# Patient Record
Sex: Female | Born: 1935 | Race: White | Hispanic: No | Smoking: Never smoker
Health system: Southern US, Community
[De-identification: ages and names within clinical notes are randomized; demographics above are authoritative.]

## PROBLEM LIST (undated history)

## (undated) DIAGNOSIS — F0390 Unspecified dementia without behavioral disturbance: Secondary | ICD-10-CM

## (undated) DIAGNOSIS — C801 Malignant (primary) neoplasm, unspecified: Secondary | ICD-10-CM

## (undated) DIAGNOSIS — E785 Hyperlipidemia, unspecified: Secondary | ICD-10-CM

## (undated) DIAGNOSIS — H409 Unspecified glaucoma: Secondary | ICD-10-CM

## (undated) DIAGNOSIS — I10 Essential (primary) hypertension: Secondary | ICD-10-CM

## (undated) DIAGNOSIS — K5790 Diverticulosis of intestine, part unspecified, without perforation or abscess without bleeding: Secondary | ICD-10-CM

## (undated) DIAGNOSIS — M8580 Other specified disorders of bone density and structure, unspecified site: Secondary | ICD-10-CM

## (undated) HISTORY — PX: CATARACT EXTRACTION, BILATERAL: SHX1313

## (undated) HISTORY — PX: OOPHORECTOMY: SHX86

## (undated) HISTORY — DX: Unspecified glaucoma: H40.9

## (undated) HISTORY — PX: TONSILLECTOMY: SHX5217

## (undated) HISTORY — DX: Unspecified dementia, unspecified severity, without behavioral disturbance, psychotic disturbance, mood disturbance, and anxiety: F03.90

## (undated) HISTORY — DX: Hyperlipidemia, unspecified: E78.5

## (undated) HISTORY — DX: Essential (primary) hypertension: I10

## (undated) HISTORY — DX: Other specified disorders of bone density and structure, unspecified site: M85.80

## (undated) HISTORY — DX: Diverticulosis of intestine, part unspecified, without perforation or abscess without bleeding: K57.90

---

## 1997-09-16 ENCOUNTER — Other Ambulatory Visit: Admission: RE | Admit: 1997-09-16 | Discharge: 1997-09-16 | Payer: Self-pay | Admitting: Cardiology

## 1997-09-21 ENCOUNTER — Other Ambulatory Visit: Admission: RE | Admit: 1997-09-21 | Discharge: 1997-09-21 | Payer: Self-pay | Admitting: Gynecology

## 1998-09-23 ENCOUNTER — Other Ambulatory Visit: Admission: RE | Admit: 1998-09-23 | Discharge: 1998-09-23 | Payer: Self-pay | Admitting: Gynecology

## 1999-09-07 ENCOUNTER — Encounter: Admission: RE | Admit: 1999-09-07 | Discharge: 1999-09-07 | Payer: Self-pay | Admitting: Gynecology

## 1999-09-07 ENCOUNTER — Encounter: Payer: Self-pay | Admitting: Gynecology

## 1999-11-15 ENCOUNTER — Other Ambulatory Visit: Admission: RE | Admit: 1999-11-15 | Discharge: 1999-11-15 | Payer: Self-pay | Admitting: Gynecology

## 2000-04-23 ENCOUNTER — Encounter: Admission: RE | Admit: 2000-04-23 | Discharge: 2000-04-23 | Payer: Self-pay | Admitting: Gynecology

## 2000-04-23 ENCOUNTER — Encounter: Payer: Self-pay | Admitting: Gynecology

## 2000-11-21 ENCOUNTER — Other Ambulatory Visit: Admission: RE | Admit: 2000-11-21 | Discharge: 2000-11-21 | Payer: Self-pay | Admitting: Gynecology

## 2000-11-29 ENCOUNTER — Encounter: Admission: RE | Admit: 2000-11-29 | Discharge: 2000-11-29 | Payer: Self-pay | Admitting: Gynecology

## 2000-11-29 ENCOUNTER — Encounter: Payer: Self-pay | Admitting: Gynecology

## 2000-12-18 ENCOUNTER — Encounter: Payer: Self-pay | Admitting: Gynecology

## 2000-12-18 ENCOUNTER — Encounter: Admission: RE | Admit: 2000-12-18 | Discharge: 2000-12-18 | Payer: Self-pay | Admitting: Gynecology

## 2001-12-03 ENCOUNTER — Other Ambulatory Visit: Admission: RE | Admit: 2001-12-03 | Discharge: 2001-12-03 | Payer: Self-pay | Admitting: Gynecology

## 2001-12-19 ENCOUNTER — Encounter: Admission: RE | Admit: 2001-12-19 | Discharge: 2001-12-19 | Payer: Self-pay | Admitting: Gynecology

## 2001-12-19 ENCOUNTER — Encounter: Payer: Self-pay | Admitting: Gynecology

## 2002-12-25 ENCOUNTER — Encounter: Payer: Self-pay | Admitting: Gynecology

## 2002-12-25 ENCOUNTER — Encounter: Admission: RE | Admit: 2002-12-25 | Discharge: 2002-12-25 | Payer: Self-pay | Admitting: Gynecology

## 2003-12-11 ENCOUNTER — Other Ambulatory Visit: Admission: RE | Admit: 2003-12-11 | Discharge: 2003-12-11 | Payer: Self-pay | Admitting: Gynecology

## 2004-01-19 ENCOUNTER — Encounter: Admission: RE | Admit: 2004-01-19 | Discharge: 2004-01-19 | Payer: Self-pay | Admitting: Gynecology

## 2005-02-07 ENCOUNTER — Encounter: Admission: RE | Admit: 2005-02-07 | Discharge: 2005-02-07 | Payer: Self-pay | Admitting: Gynecology

## 2005-12-27 ENCOUNTER — Other Ambulatory Visit: Admission: RE | Admit: 2005-12-27 | Discharge: 2005-12-27 | Payer: Self-pay | Admitting: Gynecology

## 2006-02-13 ENCOUNTER — Encounter: Admission: RE | Admit: 2006-02-13 | Discharge: 2006-02-13 | Payer: Self-pay | Admitting: Gynecology

## 2007-01-09 ENCOUNTER — Other Ambulatory Visit: Admission: RE | Admit: 2007-01-09 | Discharge: 2007-01-09 | Payer: Self-pay | Admitting: Gynecology

## 2007-04-02 ENCOUNTER — Encounter: Admission: RE | Admit: 2007-04-02 | Discharge: 2007-04-02 | Payer: Self-pay | Admitting: Gynecology

## 2008-04-07 ENCOUNTER — Encounter: Admission: RE | Admit: 2008-04-07 | Discharge: 2008-04-07 | Payer: Self-pay | Admitting: Gynecology

## 2009-04-26 ENCOUNTER — Encounter: Admission: RE | Admit: 2009-04-26 | Discharge: 2009-04-26 | Payer: Self-pay | Admitting: Cardiology

## 2009-05-25 ENCOUNTER — Encounter: Admission: RE | Admit: 2009-05-25 | Discharge: 2009-05-25 | Payer: Self-pay | Admitting: Cardiology

## 2010-05-22 HISTORY — PX: ABDOMINAL HYSTERECTOMY: SHX81

## 2010-06-23 ENCOUNTER — Ambulatory Visit (INDEPENDENT_AMBULATORY_CARE_PROVIDER_SITE_OTHER): Payer: Medicare Other | Admitting: Cardiology

## 2010-06-23 DIAGNOSIS — Z79899 Other long term (current) drug therapy: Secondary | ICD-10-CM

## 2010-06-23 DIAGNOSIS — E7800 Pure hypercholesterolemia, unspecified: Secondary | ICD-10-CM

## 2010-11-28 ENCOUNTER — Encounter: Payer: Self-pay | Admitting: Cardiology

## 2010-12-05 ENCOUNTER — Ambulatory Visit (INDEPENDENT_AMBULATORY_CARE_PROVIDER_SITE_OTHER): Payer: Medicare Other | Admitting: Cardiology

## 2010-12-05 ENCOUNTER — Encounter: Payer: Self-pay | Admitting: Cardiology

## 2010-12-05 DIAGNOSIS — E785 Hyperlipidemia, unspecified: Secondary | ICD-10-CM

## 2010-12-05 DIAGNOSIS — Z79899 Other long term (current) drug therapy: Secondary | ICD-10-CM

## 2010-12-05 DIAGNOSIS — I119 Hypertensive heart disease without heart failure: Secondary | ICD-10-CM

## 2010-12-05 DIAGNOSIS — E7800 Pure hypercholesterolemia, unspecified: Secondary | ICD-10-CM | POA: Insufficient documentation

## 2010-12-05 DIAGNOSIS — F0390 Unspecified dementia without behavioral disturbance: Secondary | ICD-10-CM

## 2010-12-05 NOTE — Assessment & Plan Note (Signed)
The patient has a past history of essential hypertension.Her pressure has responded nicely to [facility].  Her blood pressure has been easier to control since she has lost much weight.  The patient denies chest pain or shortness of breath dizziness or syncope.

## 2010-12-05 NOTE — Assessment & Plan Note (Signed)
The patient has a past history of hypercholesterolemia.  She is on Lipitor 10 mg daily.  She has not been experiencing any myalgias or other side effects from the statin therapy.

## 2010-12-05 NOTE — Progress Notes (Signed)
Shelley Adams Date of Birth:  08/27/1922 [facility] Cardiology / [facility] [address]   [address] [place], [place]  [address] [phone]           Fax   [phone]  History of Present Illness: This pleasant 77 year old woman is seen for a four-month followup office visit.  She has a history of essential hypertension and hypercholesterolemia.  She also has worsening dementia.  She has seen Dr. Anne Hahn who added Namenda to her regimen and she continues on Aricept as well the patient has not been expressing any new cardiac symptoms.  She denies any chest pain or shortness of breath her weight has stabilized since last visit and she eats one good meal daily when her family takes her out for lunch.  The patient is able to continue to live in her on home.  Current Outpatient Prescriptions  Medication Sig Dispense Refill  . atorvastatin (LIPITOR) 10 MG tablet Take 10 mg by mouth daily.        Marland Kitchen donepezil (ARICEPT) 5 MG tablet Take 5 mg by mouth at bedtime as needed.        . Memantine HCl (NAMENDA PO) Take 2 tablets by mouth daily. Taking one daily      . Multiple Vitamin (MULTIVITAMIN PO) Take 1 tablet by mouth daily.        . temazepam (RESTORIL) 15 MG capsule Take 15 mg by mouth at bedtime as needed.        . triamterene-hydrochlorothiazide (MAXZIDE-25) 37.5-25 MG per tablet Take 1 tablet by mouth daily. Taking 1/2 every other day      . Calcium Citrate (CITRACAL PO) Take 1 tablet by mouth daily.          No Known Allergies  Patient Active Problem List  Diagnoses  . Dementia  . Hypercholesterolemia  . Benign hypertensive heart disease without heart failure    History  Smoking status  . Never Smoker   Smokeless tobacco  . Not on file    History  Alcohol Use No    Family History  Problem Relation Age of Onset  . Coronary artery disease Father   . Transient ischemic attack Father   . Heart attack Brother   . Parkinsonism Sister     Review of  Systems: Constitutional: no fever chills diaphoresis or fatigue or change in weight.  Head and neck: no hearing loss, no epistaxis, no photophobia or visual disturbance. Respiratory: No cough, shortness of breath or wheezing. Cardiovascular: No chest pain peripheral edema, palpitations. Gastrointestinal: No abdominal distention, no abdominal pain, no change in bowel habits hematochezia or melena. Genitourinary: No dysuria, no frequency, no urgency, no nocturia. Musculoskeletal:No arthralgias, no back pain, no gait disturbance or myalgias. Neurological: No dizziness, no headaches, no numbness, no seizures, no syncope, no weakness, no tremors. Hematologic: No lymphadenopathy, no easy bruising. Psychiatric: No confusion, no hallucinations, no sleep disturbance.    Physical Exam: Filed Vitals:   12/05/10 1007  BP: 110/70  Pulse: 80  The general appearance reveals a thin elderly woman in no acute distress.Pupils equal and reactive.   Extraocular Movements are full.  There is no scleral icterus.  The mouth and pharynx are normal.  The neck is supple.  The carotids reveal no bruits.  The jugular venous pressure is normal.  The thyroid is not enlarged.  There is no lymphadenopathy.The chest is clear to percussion and auscultation. There are no rales or rhonchi. Expansion of the chest is symmetrical.The precordium  is quiet.  The first heart sound is normal.  The second heart sound is physiologically split.  There is no murmur gallop rub or click.  There is no abnormal lift or heave.The abdomen is soft and nontender. Bowel sounds are normal. The liver and spleen are not enlarged. There Are no abdominal masses. There are no bruits.The pedal pulses are good.  There is no phlebitis or edema.  There is no cyanosis or clubbing.  Neurologic exam reveals dementia and the patient asked the same question over and over.The skin is warm and dry.  There is no rash.   Assessment / Plan:  The patient forgot to fast  today so did not draw any blood.  We will postpone that until next visit.  She'll return in about 5 months for followup office visit and fasting lipid panel and chemistries

## 2010-12-05 NOTE — Assessment & Plan Note (Signed)
This pleasant 77 year old woman has a history of progressive dementia.  She is on Aricept and Namenda.  She has seen Dr. Anne Hahn in the past.  She continues to be able to live in her own home.  Her family hopes to allow her to continue there as long as possible.  Her family takes her out to lunch each day and she gets a good meal at a restaurant once a day.  On her previous visit she had lost 7 pounds but this time she has gained 3 pounds.

## 2011-02-06 ENCOUNTER — Other Ambulatory Visit: Payer: Self-pay | Admitting: Cardiology

## 2011-02-06 NOTE — Telephone Encounter (Signed)
Refilled aricept

## 2011-04-05 ENCOUNTER — Telehealth: Payer: Self-pay | Admitting: *Deleted

## 2011-04-05 ENCOUNTER — Other Ambulatory Visit: Payer: Self-pay | Admitting: Cardiology

## 2011-04-05 NOTE — Telephone Encounter (Signed)
[facility] should be refilled by Dr. Anne Hahn

## 2011-04-24 ENCOUNTER — Other Ambulatory Visit: Payer: Medicare Other | Admitting: *Deleted

## 2011-05-01 ENCOUNTER — Encounter: Payer: Self-pay | Admitting: Cardiology

## 2011-05-01 ENCOUNTER — Ambulatory Visit (INDEPENDENT_AMBULATORY_CARE_PROVIDER_SITE_OTHER): Payer: Medicare Other | Admitting: Cardiology

## 2011-05-01 ENCOUNTER — Other Ambulatory Visit (INDEPENDENT_AMBULATORY_CARE_PROVIDER_SITE_OTHER): Payer: Medicare Other | Admitting: *Deleted

## 2011-05-01 VITALS — BP 108/64 | HR 80 | Ht 64.0 in | Wt 92.0 lb

## 2011-05-01 DIAGNOSIS — I119 Hypertensive heart disease without heart failure: Secondary | ICD-10-CM

## 2011-05-01 DIAGNOSIS — E785 Hyperlipidemia, unspecified: Secondary | ICD-10-CM

## 2011-05-01 DIAGNOSIS — E7800 Pure hypercholesterolemia, unspecified: Secondary | ICD-10-CM

## 2011-05-01 DIAGNOSIS — F0390 Unspecified dementia without behavioral disturbance: Secondary | ICD-10-CM

## 2011-05-01 DIAGNOSIS — Z79899 Other long term (current) drug therapy: Secondary | ICD-10-CM

## 2011-05-01 LAB — LIPID PANEL
Cholesterol: 146 mg/dL (ref 0–200)
LDL Cholesterol: 58 mg/dL (ref 0–99)

## 2011-05-01 LAB — HEPATIC FUNCTION PANEL: Bilirubin, Direct: 0.2 mg/dL (ref 0.0–0.3)

## 2011-05-01 LAB — BASIC METABOLIC PANEL
BUN: 24 mg/dL — ABNORMAL HIGH (ref 6–23)
CO2: 32 mEq/L (ref 19–32)
Creatinine, Ser: 0.8 mg/dL (ref 0.4–1.2)
GFR: 69.17 mL/min (ref >60.00)
Potassium: 3.8 mEq/L (ref 3.5–5.1)
Sodium: 141 mEq/L (ref 135–145)

## 2011-05-01 NOTE — Assessment & Plan Note (Signed)
The patient is followed by Dr. Anne Hahn for her dementia.  She is on Namenda and Aricept.  Clinically to me she appears to be about the same as last visit

## 2011-05-01 NOTE — Progress Notes (Signed)
Shelley Adams Date of Birth:  December 26, 1922 [facility] Cardiology / [facility] [address]   [address] [place], [place]  [address] [phone]           Fax   [phone]  History of Present Illness: This pleasant 77 year old woman is seen for a scheduled followup office visit.  She has a history of essential hypertension and hypercholesterolemia.  She also has dementia.  She has had problems maintaining her weight.  According to her family she is very sparingly.  The family has arranged for her to go out every noontime with friends or family for a meal.  Otherwise they gave her food from the grocery store that she can easily microwave.  However she does not eat much at a time.  Her weight has gone up 1 pound since last visit however.  Current Outpatient Prescriptions  Medication Sig Dispense Refill  . ARICEPT 5 MG tablet TAKE 1 TABLET DAILY.  30 each  11  . atorvastatin (LIPITOR) 10 MG tablet Take 10 mg by mouth daily.        . Multiple Vitamin (MULTIVITAMIN PO) Take 1 tablet by mouth daily.        Marland Kitchen NAMENDA 10 MG tablet TAKE 1 TABLET TWICE DAILY.  60 each  5  . temazepam (RESTORIL) 15 MG capsule Take 15 mg by mouth at bedtime as needed.        . triamterene-hydrochlorothiazide (MAXZIDE-25) 37.5-25 MG per tablet Take 1 tablet by mouth daily. Taking 1/2 every other day        No Known Allergies  Patient Active Problem List  Diagnoses  . Dementia  . Hypercholesterolemia  . Benign hypertensive heart disease without heart failure    History  Smoking status  . Never Smoker   Smokeless tobacco  . Not on file    History  Alcohol Use No    Family History  Problem Relation Age of Onset  . Coronary artery disease Father   . Transient ischemic attack Father   . Heart attack Brother   . Parkinsonism Sister     Review of Systems: Constitutional: no fever chills diaphoresis or fatigue or change in weight.  Head and neck: no hearing loss, no epistaxis, no photophobia or  visual disturbance. Respiratory: No cough, shortness of breath or wheezing. Cardiovascular: No chest pain peripheral edema, palpitations. Gastrointestinal: No abdominal distention, no abdominal pain, no change in bowel habits hematochezia or melena. Genitourinary: No dysuria, no frequency, no urgency, no nocturia. Musculoskeletal:No arthralgias, no back pain, no gait disturbance or myalgias. Neurological: No dizziness, no headaches, no numbness, no seizures, no syncope, no weakness, no tremors. Hematologic: No lymphadenopathy, no easy bruising. Psychiatric: No confusion, no hallucinations, no sleep disturbance.    Physical Exam: Filed Vitals:   05/01/11 1109  BP: 108/64  Pulse: 80   the general appearance reveals a well-developed well-nourished thin woman in no distress.  She has mild dementia but is very pleasant socially.Pupils equal and reactive.   Extraocular Movements are full.  There is no scleral icterus.  The mouth and pharynx are normal.  The neck is supple.  The carotids reveal no bruits.  The jugular venous pressure is normal.  The thyroid is not enlarged.  There is no lymphadenopathy.  The chest is clear to percussion and auscultation. There are no rales or rhonchi. Expansion of the chest is symmetrical.  The precordium is quiet.  The first heart sound is normal.  The second heart sound is  physiologically split.  There is no murmur gallop rub or click.  There is no abnormal lift or heave.  The abdomen is soft and nontender. Bowel sounds are normal. The liver and spleen are not enlarged. There Are no abdominal masses. There are no bruits.  The pedal pulses are good.  There is no phlebitis or edema.  There is no cyanosis or clubbing. Strength is normal and symmetrical in all extremities.  There is no lateralizing weakness.  There are no sensory deficits.  The skin is warm and dry.  There is no rash.    Assessment / Plan: Continue on same medication.  Try to gain some  weight.  She will use protein supplements as necessary.  Recheck in 5 months for followup office visit lipid panel hepatic function panel and basal metabolic

## 2011-05-01 NOTE — Patient Instructions (Signed)
Your physician recommends that you continue on your current medications as directed. Please refer to the Current Medication list given to you today. Try to eat more to increase your weight Your physician wants you to follow-up in: 5 months You will receive a reminder letter in the mail two months in advance. If you don't receive a letter, please call our office to schedule the follow-up appointment.

## 2011-05-01 NOTE — Assessment & Plan Note (Signed)
Patient has a history of hypercholesterolemia.  She remains on low-dose Lipitor.  She is not having any myalgias or side effects from the Lipitor.

## 2011-05-01 NOTE — Assessment & Plan Note (Signed)
Patient has a history of high cholesterol and high blood pressure.  She has not been having any symptoms of headaches or dizziness.  No symptoms of congestive heart failure.  She remains on low-dose diuretic for blood pressure

## 2011-05-02 ENCOUNTER — Telehealth: Payer: Self-pay | Admitting: *Deleted

## 2011-05-02 NOTE — Telephone Encounter (Signed)
Message copied by Burnell Blanks on Tue May 02, 2011  2:14 PM ------      Message from: Cassell Clement      Created: Mon May 01, 2011  9:04 PM       Please report.  The labs are stable.  Continue same meds.  Continue careful diet.Report to daughter.

## 2011-05-02 NOTE — Telephone Encounter (Signed)
Advised daughter 

## 2011-06-20 ENCOUNTER — Other Ambulatory Visit: Payer: Self-pay | Admitting: Cardiology

## 2011-06-20 ENCOUNTER — Other Ambulatory Visit: Payer: Self-pay

## 2011-06-20 MED ORDER — ATORVASTATIN CALCIUM 10 MG PO TABS: 10.0000 mg | ORAL_TABLET | Freq: Every day | ORAL | Status: DC

## 2011-07-01 ENCOUNTER — Other Ambulatory Visit: Payer: Self-pay | Admitting: Cardiology

## 2011-07-03 NOTE — Telephone Encounter (Signed)
Refilled generic maxzide

## 2011-10-04 ENCOUNTER — Encounter: Payer: Self-pay | Admitting: Cardiology

## 2011-10-04 ENCOUNTER — Ambulatory Visit (INDEPENDENT_AMBULATORY_CARE_PROVIDER_SITE_OTHER): Payer: Medicare Other | Admitting: Cardiology

## 2011-10-04 VITALS — BP 118/80 | HR 88 | Ht 62.0 in | Wt 90.0 lb

## 2011-10-04 DIAGNOSIS — E7800 Pure hypercholesterolemia, unspecified: Secondary | ICD-10-CM

## 2011-10-04 DIAGNOSIS — F0390 Unspecified dementia without behavioral disturbance: Secondary | ICD-10-CM

## 2011-10-04 DIAGNOSIS — N950 Postmenopausal bleeding: Secondary | ICD-10-CM

## 2011-10-04 DIAGNOSIS — I119 Hypertensive heart disease without heart failure: Secondary | ICD-10-CM

## 2011-10-04 NOTE — Progress Notes (Signed)
Shelley Adams Date of Birth:  1923/05/09 [facility] [address] [place], [place]  [address] [phone]         Fax   [phone]  History of Present Illness: This pleasant 78 year old widowed Caucasian female is seen for a four-month followup office visit.  She has a past history of essential hypertension and hypercholesterolemia.  She also has severe dementia.  She has had progressive weight loss due to not eating enough food.  For the past 4 months she has had some vaginal spotting and bleeding that she had not told her family about until recently.  She denies any abdominal pain.  Current Outpatient Prescriptions  Medication Sig Dispense Refill  . ARICEPT 5 MG tablet TAKE 1 TABLET DAILY.  30 each  11  . atorvastatin (LIPITOR) 10 MG tablet Take 1 tablet (10 mg total) by mouth daily.  30 tablet  4  . MAXZIDE 75-50 MG per tablet TAKE 1/2 TABLET DAILY.  15 each  11  . Multiple Vitamin (MULTIVITAMIN PO) Take 1 tablet by mouth daily.        Marland Kitchen NAMENDA 10 MG tablet TAKE 1 TABLET TWICE DAILY.  60 each  5  . DISCONTD: triamterene-hydrochlorothiazide (MAXZIDE-25) 37.5-25 MG per tablet Take 1 tablet by mouth daily. Taking 1/2 every other day        No Known Allergies  Patient Active Problem List  Diagnoses  . Dementia  . Hypercholesterolemia  . Benign hypertensive heart disease without heart failure    History  Smoking status  . Never Smoker   Smokeless tobacco  . Not on file    History  Alcohol Use No    Family History  Problem Relation Age of Onset  . Coronary artery disease Father   . Transient ischemic attack Father   . Heart attack Brother   . Parkinsonism Sister     Review of Systems: Constitutional: no fever chills diaphoresis or fatigue or change in weight.  Head and neck: no hearing loss, no epistaxis, no photophobia or visual disturbance. Respiratory: No cough, shortness of breath or wheezing. Cardiovascular: No chest pain  peripheral edema, palpitations. Gastrointestinal: No abdominal distention, no abdominal pain, no change in bowel habits hematochezia or melena. Genitourinary: No dysuria, no frequency, no urgency, no nocturia. Musculoskeletal:No arthralgias, no back pain, no gait disturbance or myalgias. Neurological: No dizziness, no headaches, no numbness, no seizures, no syncope, no weakness, no tremors. Hematologic: No lymphadenopathy, no easy bruising. Psychiatric: No confusion, no hallucinations, no sleep disturbance.    Physical Exam: Filed Vitals:   10/04/11 1124  BP: 118/80  Pulse: 88   the general appearance reveals a thin elderly mildly demented woman in no acute distress.The head and neck exam reveals pupils equal and reactive.  Extraocular movements are full.  There is no scleral icterus.  The mouth and pharynx are normal.  The neck is supple.  The carotids reveal no bruits.  The jugular venous pressure is normal.  The  thyroid is not enlarged.  There is no lymphadenopathy.  The chest is clear to percussion and auscultation.  There are no rales or rhonchi.  Expansion of the chest is symmetrical.  The precordium is quiet.  The first heart sound is normal.  The second heart sound is physiologically split.  There is no murmur gallop rub or click.  There is no abnormal lift or heave.  The abdomen is soft and nontender.  The bowel sounds are normal.  The liver and  spleen are not enlarged.  There are no abdominal masses.  There are no abdominal bruits.  Extremities reveal good pedal pulses.  There is no phlebitis or edema.  There is no cyanosis or clubbing.  Strength is normal and symmetrical in all extremities.  There is no lateralizing weakness.  There are no sensory deficits.  The skin is warm and dry.  There is no rash.     Assessment / Plan: Continue same medication.  Referred to Dr. Tenny Craw, gynecologist who will see her on 10/06/11.  Recheck here in 5 months for followup office visit CBC and fasting  lab work.

## 2011-10-04 NOTE — Assessment & Plan Note (Signed)
The patient has not seen a gynecologist for many years.  The patient herself because of her dementia was really not aware of her problem but the family became aware when they did the patient's laundry.  The patient is not on any estrogen therapy.  We have arranged for the patient to see Dr. Tenny Craw, gynecologist, on May 17.

## 2011-10-04 NOTE — Assessment & Plan Note (Signed)
The patient has not been experiencing any chest pain or shortness of breath.  No palpitations.  No headaches.

## 2011-10-04 NOTE — Assessment & Plan Note (Signed)
The patient's dementia appears to be slightly worse.  She is on Aricept and Namenda.  He is still able to live in her own home by herself.  Family and friends take her out to lunch every day so that she gets at least one good meal a day.  They also prepare food for her to heat up at night in the microwave.  Since last visit her weight is down 2 more pounds.

## 2011-10-04 NOTE — Assessment & Plan Note (Signed)
A. she has a history of hypercholesterolemia.  She remains on low-dose Lipitor 10 mg daily.  We will plan to check fasting lab work at her next visit

## 2011-10-04 NOTE — Patient Instructions (Signed)
Scheduled appointment Friday 5/17 at 10:00 for 10:15 appointment with Dr A. Ross at [facility] [phone]  Your physician recommends that you continue on your current medications as directed. Please refer to the Current Medication list given to you today.  Your physician recommends that you schedule a follow-up appointment in: 5 months ov/lp/bmet/hfp/cbc

## 2011-10-19 ENCOUNTER — Ambulatory Visit: Payer: Medicare Other | Attending: Gynecologic Oncology | Admitting: Gynecologic Oncology

## 2011-10-19 ENCOUNTER — Encounter: Payer: Self-pay | Admitting: Gynecologic Oncology

## 2011-10-19 VITALS — BP 102/60 | HR 64 | Temp 97.8°F | Resp 14 | Ht 63.11 in | Wt 90.8 lb

## 2011-10-19 DIAGNOSIS — Z803 Family history of malignant neoplasm of breast: Secondary | ICD-10-CM | POA: Insufficient documentation

## 2011-10-19 DIAGNOSIS — C55 Malignant neoplasm of uterus, part unspecified: Secondary | ICD-10-CM | POA: Insufficient documentation

## 2011-10-19 DIAGNOSIS — C541 Malignant neoplasm of endometrium: Secondary | ICD-10-CM

## 2011-10-19 DIAGNOSIS — F0390 Unspecified dementia without behavioral disturbance: Secondary | ICD-10-CM | POA: Insufficient documentation

## 2011-10-19 DIAGNOSIS — Z79899 Other long term (current) drug therapy: Secondary | ICD-10-CM | POA: Insufficient documentation

## 2011-10-19 NOTE — Progress Notes (Signed)
Consult Note: Gyn-Onc  Consult was requested by Dr. Tenny Craw for the evaluation of Shelley Adams 78 y.o. female  CHIEF COMPLAINT: Uterine serous cancer  HPI: This is a lovely 77 year old reported approximately 4 months of vaginal bleeding. 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  The patient has severe dementia and additional history is not obtainable. Her medical records are notable for prior bilateral salpingo-oophorectomy however there is no pathology report available for confirmation and the patient and her daughter not aware of this procedure   Current Meds:  Outpatient Encounter Prescriptions as of 10/19/2011  Medication Sig Dispense Refill  . ARICEPT 5 MG tablet TAKE 1 TABLET DAILY.  30 each  11  . atorvastatin (LIPITOR) 10 MG tablet Take 1 tablet (10 mg total) by mouth daily.  30 tablet  4  . MAXZIDE 75-50 MG per tablet TAKE 1/2 TABLET DAILY.  15 each  11  . Multiple Vitamin (MULTIVITAMIN PO) Take 1 tablet by mouth daily.        Marland Kitchen NAMENDA 10 MG tablet TAKE 1 TABLET TWICE DAILY.  60 each  5    Allergy: No Known Allergies  Social Hx:   History   Social History  . Marital Status: Widowed    Spouse Name: N/A    Number of Children: N/A  . Years of Education: N/A   Occupational History  . Not on file.   Social History Main Topics  . Smoking status: Never Smoker   . Smokeless tobacco: Not on file  . Alcohol Use: No  . Drug Use:   . Sexually Active:    Other Topics Concern  . Not on file   Social History Narrative  . No narrative on file    Past Surgical Hx:  Past Surgical History  Procedure Date  . Cataract extraction, bilateral   . Oophorectomy     left  . Tonsillectomy     Past Medical Hx:  Past Medical History  Diagnosis Date  . Hypertension   . Hyperlipidemia     history of  . Osteopenia   . Diverticulosis     history of  . Dementia     Past Gynecological History: G2P2 No history of abnnormal pap.   Patient' and her daughter do not recall prior BSO as noted in the medical records  Family Hx:  Family History  Problem Relation Age of Onset  . Coronary artery disease Father   . Transient ischemic attack Father   . Heart attack Brother   . Parkinsonism Sister     Daughter with diagnosis of breast cancer at age 50 treated with mastectomy  Review of Systems:  Constitutional  Feels well,  Cardiovascular  No chest pain, shortness of breath, or edema  Pulmonary  No cough or wheeze.  Gastro Intestinal  No nausea, vomitting, or diarrhoea. No bright red blood per rectum, no abdominal pain, change in bowel movement, or constipation.  Genito Urinary  No frequency, urgency, dysuria, occasional vaginal bleeding Musculo Skeletal  No myalgia, arthralgia, joint swelling or pain  Neurologic  No weakness, numbness, change in gait,  Psychology  No depression, anxiety, insomnia.   Vitals: BP 102/60  Pulse 64  Temp(Src) 97.8 F (36.6 C) (Oral)  Resp 14  Ht 5' 3.11" (1.603 m)  Wt 90 lb 12.8 oz (41.187 kg)  BMI 16.03 kg/m2  Physical Exam: WD in NAD Neck  Supple NROM, without any enlargements.  Lymph Node Survey No cervical supraclavicular or  inguinal adenopathy Cardiovascular  Pulse normal rate, regularity and rhythm. S1 and S2 normal.  Lungs  Clear to auscultation bilateraly, without wheezes/crackles/rhonchi. Good air movement.  Skin  No rash/lesions/breakdown  Psychiatry  Alert and oriented to person, place, and time  Abdomen  Normoactive bowel sounds, abdomen soft, non-tender and obese. Midline abdominal incision without  evidence of hernia.  Back No CVA tenderness Genito Urinary  Vulva/vagina: Normal external female genitalia.  No lesions. No discharge or bleeding.  Bladder/urethra:  No lesions or masses  Vagina:atrophic, no masses or lesions.  Cervix: Normal appearing, no lesions.  Uterus: Small, mobile, descensus to mid vagina. Mobile, no palpable masses Uterus sounded  to 7cm no parametrial involvement or nodularity.  Adnexa: No palpable masses. Rectal  Good tone, no masses no cul de sac nodularity.  Extremities  No bilateral cyanosis, clubbing or edema.   Assessment/Plan:  Ms. Shelley Adams  is a 78 y.o.  year old with uterine serous cancer, severe dementia and a daughter with breast cancer diagnosed at the age of 5. The patient's daughter is aware that the standard treatment of uterine serous cancer would be inclusive of hysterectomy, staging and possible chemotherapy and radiotherapy.  In this scenario however, alternative consideration could be for simply a hysterectomy so that there could be control of the uterine bleeding with supportive care if this is cancer progresses and becomes symptoms.  Since there is moderate descensus and the uterus sounded to 7 cm I have advised a vaginal hysterectomy. It's unclear whether or not the patient does indeed still have bilateral adnexa however I counseled the patient's daughter that hysterectomy is the primary goal of surgical procedure. She is aware that there may be a need to convert this to an abdominal procedure based on operative findings.  The patient's daughter is aware that Dr. De Blanch is available to perform this procedure either on June 18 or November 14 2011.  She will discuss this information with her sister and provide [place] with that decision within the next 2 weeks.  All of her questions were answered to her satisfaction.   Laurette Schimke, MD, PhD 10/19/2011, 11:30 AM

## 2011-10-19 NOTE — Patient Instructions (Signed)
Dr. De Blanch is available to perform total vaginal hysterectomy either on June 18 or November 14 2011.

## 2011-11-15 ENCOUNTER — Encounter (HOSPITAL_COMMUNITY): Payer: Self-pay | Admitting: Pharmacy Technician

## 2011-11-17 ENCOUNTER — Encounter (HOSPITAL_COMMUNITY)
Admission: RE | Admit: 2011-11-17 | Discharge: 2011-11-17 | Disposition: A | Discharge status: Home / Self Care | Payer: Medicare Other | Source: Ambulatory Visit | Attending: Obstetrics & Gynecology | Admitting: Obstetrics & Gynecology

## 2011-11-17 ENCOUNTER — Ambulatory Visit: Payer: Medicare Other | Attending: Gynecology | Admitting: Gynecology

## 2011-11-17 ENCOUNTER — Ambulatory Visit (HOSPITAL_COMMUNITY)
Admission: RE | Admit: 2011-11-17 | Discharge: 2011-11-17 | Disposition: A | Discharge status: Home / Self Care | Payer: Medicare Other | Source: Ambulatory Visit | Attending: Gynecology | Admitting: Gynecology

## 2011-11-17 ENCOUNTER — Encounter: Payer: Self-pay | Admitting: Gynecology

## 2011-11-17 ENCOUNTER — Encounter (HOSPITAL_COMMUNITY): Payer: Self-pay

## 2011-11-17 VITALS — BP 110/58 | HR 68 | Temp 97.9°F | Resp 16 | Ht 63.11 in | Wt 90.8 lb

## 2011-11-17 DIAGNOSIS — Z01812 Encounter for preprocedural laboratory examination: Secondary | ICD-10-CM | POA: Insufficient documentation

## 2011-11-17 DIAGNOSIS — F0390 Unspecified dementia without behavioral disturbance: Secondary | ICD-10-CM | POA: Insufficient documentation

## 2011-11-17 DIAGNOSIS — C549 Malignant neoplasm of corpus uteri, unspecified: Secondary | ICD-10-CM | POA: Insufficient documentation

## 2011-11-17 DIAGNOSIS — C541 Malignant neoplasm of endometrium: Secondary | ICD-10-CM

## 2011-11-17 DIAGNOSIS — I700 Atherosclerosis of aorta: Secondary | ICD-10-CM | POA: Insufficient documentation

## 2011-11-17 DIAGNOSIS — Z01810 Encounter for preprocedural cardiovascular examination: Secondary | ICD-10-CM | POA: Insufficient documentation

## 2011-11-17 DIAGNOSIS — Z8249 Family history of ischemic heart disease and other diseases of the circulatory system: Secondary | ICD-10-CM | POA: Insufficient documentation

## 2011-11-17 DIAGNOSIS — M949 Disorder of cartilage, unspecified: Secondary | ICD-10-CM | POA: Insufficient documentation

## 2011-11-17 DIAGNOSIS — M899 Disorder of bone, unspecified: Secondary | ICD-10-CM | POA: Insufficient documentation

## 2011-11-17 DIAGNOSIS — M4120 Other idiopathic scoliosis, site unspecified: Secondary | ICD-10-CM | POA: Insufficient documentation

## 2011-11-17 DIAGNOSIS — I10 Essential (primary) hypertension: Secondary | ICD-10-CM | POA: Insufficient documentation

## 2011-11-17 DIAGNOSIS — K5730 Diverticulosis of large intestine without perforation or abscess without bleeding: Secondary | ICD-10-CM | POA: Insufficient documentation

## 2011-11-17 DIAGNOSIS — Z01818 Encounter for other preprocedural examination: Secondary | ICD-10-CM | POA: Insufficient documentation

## 2011-11-17 DIAGNOSIS — E785 Hyperlipidemia, unspecified: Secondary | ICD-10-CM | POA: Insufficient documentation

## 2011-11-17 DIAGNOSIS — R9431 Abnormal electrocardiogram [ECG] [EKG]: Secondary | ICD-10-CM | POA: Insufficient documentation

## 2011-11-17 HISTORY — DX: Malignant (primary) neoplasm, unspecified: C80.1

## 2011-11-17 LAB — DIFFERENTIAL
Basophils Absolute: 0 10*3/uL (ref 0.0–0.1)
Eosinophils Relative: 1 % (ref 0–5)
Lymphocytes Relative: 24 % (ref 12–46)
Neutro Abs: 5.4 10*3/uL (ref 1.7–7.7)

## 2011-11-17 LAB — CBC
Hemoglobin: 14.7 g/dL (ref 12.0–15.0)
MCH: 29.2 pg (ref 26.0–34.0)
MCV: 87.5 fL (ref 78.0–100.0)
RBC: 5.04 MIL/uL (ref 3.87–5.11)

## 2011-11-17 LAB — COMPREHENSIVE METABOLIC PANEL
BUN: 19 mg/dL (ref 6–23)
CO2: 30 mEq/L (ref 19–32)
Calcium: 10.4 mg/dL (ref 8.4–10.5)
Creatinine, Ser: 0.65 mg/dL (ref 0.50–1.10)
GFR calc Af Amer: 90 mL/min — ABNORMAL LOW (ref >90)
GFR calc non Af Amer: 78 mL/min — ABNORMAL LOW (ref >90)
Glucose, Bld: 93 mg/dL (ref 70–99)

## 2011-11-17 LAB — ABO/RH: ABO/RH(D): O POS

## 2011-11-17 NOTE — Patient Instructions (Addendum)
20 Shelley Adams  11/17/2011   Your procedure is scheduled on:  11/21/11 AT 10:15 AM  Report to SHORT STAY DEPT  at 7:45 AM.  Call this number if you have problems the morning of surgery: [phone]   Remember:   Do not eat food or drink liquids AFTER MIDNIGHT    Take these medicines the morning of surgery with A SIP OF WATER: NEMANDIA / LIPITOR   Do not wear jewelry, make-up or nail polish.  Do not wear lotions, powders, or perfumes.   Do not shave legs or underarms 48 hrs. before surgery (men may shave face)  Do not bring valuables to the hospital.  Contacts, dentures or bridgework may not be worn into surgery.  Leave suitcase in the car. After surgery it may be brought to your room.  For patients admitted to the hospital, checkout time is 11:00 AM the day of discharge.   Patients discharged the day of surgery will not be allowed to drive home.    Special Instructions:   Please read over the following fact sheets that you were given: MRSA  Information / Incentive Spirometer               SHOWER WITH BETASEPT THE NIGHT BEFORE SURGERY AND THE MORNING OF SURGERY

## 2011-11-17 NOTE — Patient Instructions (Signed)
You will have preoperative evaluation today. Surgery is scheduled for 11/21/2011.

## 2011-11-17 NOTE — Progress Notes (Signed)
Consult Note: Gyn-Onc   Shelley Adams 78 y.o. female  Chief Complaint  Patient presents with  . Endo ca    Follow up    Interval History: The patient returns with her daughter to discuss surgical management of her endometrial cancer. At her first visit was recommended she undergo a palliative vaginal hysterectomy. The patient's daughters have to consider this option and are now in agreement and wish to go ahead and schedule surgery. The patient has done well. She has some vaginal spotting at the present time but no heavy bleeding apparently she has no other complaints.  HPI:HPI: This is a lovely 78-year-old reported approximately 4 months of vaginal bleeding. 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 The patient has severe dementia and additional history is not obtainable. Her medical records are notable for prior bilateral salpingo-oophorectomy however there is no pathology report available for confirmation and the patient and her daughter not aware of this procedure      Review of Systems:10 point review of systems is negative as noted above.   Vitals: Blood pressure 110/58, pulse 68, temperature 97.9 F (36.6 C), temperature source Oral, resp. rate 16, height 5' 3.11" (1.603 m), weight 90 lb 12.8 oz (41.187 kg).  Physical Exam: General : The patient is a healthy woman in no acute distress.  HEENT: normocephalic, extraoccular movements normal; neck is supple without thyromegally  Lynphnodes: Supraclavicular and inguinal nodes not enlarged  Abdomen: Soft, non-tender, no ascites, no organomegally, no masses, no hernias. She has a well-healed low midline incision Pelvic:  EGBUS: Normal female  Vagina: Normal, no lesions  Urethra and Bladder: Normal, non-tender  Cervix appears normal. There's a small amount of blood at the cervical os Uterus: Anterior normal shape size and consistency. There is moderate descensus in using a tenaculum.    Bi-manual examination: Non-tender; no adenxal masses or nodularity  Rectal: normal sphincter tone, no masses, no blood  Lower extremities: No edema or varicosities. 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 The patient's daughter understands that the standard treatment would be to perform a hysterectomy bilateral salpingo-vitrectomy surgical staging. The patient's family, however, feels very strongly that they do not wish to have extensive surgery and was certainly not consider chemotherapy. They understand that performing a hysterectomy would be intended to remove the uterus and thereby stop the patient's vaginal bleeding and possibly slowdown the time course to progression. They're aware of the risks of surgery including hemorrhage infection injury to adjacent viscera thromboembolic complications. They are also aware that I may need to convert to an abdominal hysterectomy if not able to accomplish the surgery vaginally. Further, there where the because of her prior pelvic surgery there may be adhesions to small bowel necessitating an open procedure and possible injury to small bowel. All her questions are answered No Known Allergies  Past Medical History  Diagnosis Date  . Hypertension   . Hyperlipidemia     history of  . Osteopenia   . Diverticulosis     history of  . Dementia   . Cancer     endometrial    Past Surgical History  Procedure Date  . Cataract extraction, bilateral   . Oophorectomy     left  . Tonsillectomy     Current Outpatient Prescriptions  Medication Sig Dispense Refill  . atorvastatin (LIPITOR) 10 MG tablet Take 10 mg by mouth daily with breakfast.      .   donepezil (ARICEPT) 5 MG tablet Take 5 mg by mouth at bedtime.      . memantine (NAMENDA) 10 MG tablet Take 10 mg by mouth 2 (two) times daily.      . Multiple Vitamin (MULTIVITAMIN WITH MINERALS) TABS Take 1 tablet by mouth daily with  breakfast.      . triamterene-hydrochlorothiazide (MAXZIDE) 75-50 MG per tablet Take 0.5 tablets by mouth daily with breakfast.        History   Social History  . Marital Status: Widowed    Spouse Name: N/A    Number of Children: N/A  . Years of Education: N/A   Occupational History  . Not on file.   Social History Main Topics  . Smoking status: Never Smoker   . Smokeless tobacco: Not on file  . Alcohol Use: No  . Drug Use:   . Sexually Active:    Other Topics Concern  . Not on file   Social History Narrative  . No narrative on file    Family History  Problem Relation Age of Onset  . Coronary artery disease Father   . Transient ischemic attack Father   . Heart attack Brother   . Parkinsonism Sister       CLARKE-PEARSON,Shelley Dicenso L, MD 11/17/2011, 5:21 PM         

## 2011-11-20 MED ORDER — DEXTROSE 5 % IV SOLN
2.0000 g | INTRAVENOUS | Status: AC
  Administered 2011-11-21: 2 g via INTRAVENOUS
  Filled 2011-11-20: qty 2

## 2011-11-21 ENCOUNTER — Ambulatory Visit (HOSPITAL_COMMUNITY): Payer: Medicare Other | Admitting: Anesthesiology

## 2011-11-21 ENCOUNTER — Ambulatory Visit (HOSPITAL_COMMUNITY)
Admission: RE | Admit: 2011-11-21 | Discharge: 2011-11-22 | Disposition: A | Discharge status: Home / Self Care | Payer: Medicare Other | Source: Ambulatory Visit | Attending: Obstetrics & Gynecology | Admitting: Obstetrics & Gynecology

## 2011-11-21 ENCOUNTER — Encounter (HOSPITAL_COMMUNITY): Payer: Self-pay

## 2011-11-21 ENCOUNTER — Encounter (HOSPITAL_COMMUNITY): Payer: Self-pay | Admitting: Anesthesiology

## 2011-11-21 ENCOUNTER — Encounter (HOSPITAL_COMMUNITY)
Admission: RE | Disposition: A | Discharge status: Home / Self Care | Payer: Self-pay | Source: Ambulatory Visit | Attending: Obstetrics & Gynecology

## 2011-11-21 DIAGNOSIS — E785 Hyperlipidemia, unspecified: Secondary | ICD-10-CM | POA: Insufficient documentation

## 2011-11-21 DIAGNOSIS — I10 Essential (primary) hypertension: Secondary | ICD-10-CM | POA: Insufficient documentation

## 2011-11-21 DIAGNOSIS — C541 Malignant neoplasm of endometrium: Secondary | ICD-10-CM | POA: Diagnosis present

## 2011-11-21 DIAGNOSIS — M899 Disorder of bone, unspecified: Secondary | ICD-10-CM | POA: Insufficient documentation

## 2011-11-21 DIAGNOSIS — M949 Disorder of cartilage, unspecified: Secondary | ICD-10-CM | POA: Insufficient documentation

## 2011-11-21 DIAGNOSIS — Z79899 Other long term (current) drug therapy: Secondary | ICD-10-CM | POA: Insufficient documentation

## 2011-11-21 DIAGNOSIS — C549 Malignant neoplasm of corpus uteri, unspecified: Secondary | ICD-10-CM | POA: Insufficient documentation

## 2011-11-21 HISTORY — PX: VAGINAL HYSTERECTOMY: SHX2639

## 2011-11-21 LAB — TYPE AND SCREEN: ABO/RH(D): O POS

## 2011-11-21 SURGERY — HYSTERECTOMY, VAGINAL
Anesthesia: General | Site: Vagina | Wound class: Clean Contaminated

## 2011-11-21 MED ORDER — HYDROMORPHONE HCL PF 1 MG/ML IJ SOLN: 0.2500 mg | INTRAMUSCULAR | Status: DC | PRN

## 2011-11-21 MED ORDER — ADULT MULTIVITAMIN W/MINERALS CH
1.0000 | ORAL_TABLET | Freq: Every day | ORAL | Status: DC
  Administered 2011-11-22: 1 via ORAL
  Filled 2011-11-21 (×2): qty 1

## 2011-11-21 MED ORDER — TRIAMTERENE-HCTZ 37.5-25 MG PO TABS
1.0000 | ORAL_TABLET | Freq: Every day | ORAL | Status: DC
  Administered 2011-11-22: 1 via ORAL
  Filled 2011-11-21: qty 1

## 2011-11-21 MED ORDER — LIDOCAINE-EPINEPHRINE 1 %-1:100000 IJ SOLN
INTRAMUSCULAR | Status: AC
  Filled 2011-11-21: qty 1

## 2011-11-21 MED ORDER — NALOXONE HCL 0.4 MG/ML IJ SOLN: 0.4000 mg | INTRAMUSCULAR | Status: DC | PRN

## 2011-11-21 MED ORDER — ONDANSETRON HCL 4 MG/2ML IJ SOLN: 4.0000 mg | Freq: Four times a day (QID) | INTRAMUSCULAR | Status: DC | PRN

## 2011-11-21 MED ORDER — LIDOCAINE-EPINEPHRINE 1 %-1:100000 IJ SOLN
INTRAMUSCULAR | Status: DC | PRN
  Administered 2011-11-21: 7 mL

## 2011-11-21 MED ORDER — HYDROMORPHONE 0.3 MG/ML IV SOLN
INTRAVENOUS | Status: DC
  Administered 2011-11-21 – 2011-11-22 (×3): 0.3 mg via INTRAVENOUS
  Administered 2011-11-22: 3 mg via INTRAVENOUS

## 2011-11-21 MED ORDER — PROMETHAZINE HCL 25 MG/ML IJ SOLN: 6.2500 mg | INTRAMUSCULAR | Status: DC | PRN

## 2011-11-21 MED ORDER — ZOLPIDEM TARTRATE 5 MG PO TABS: 5.0000 mg | ORAL_TABLET | Freq: Every evening | ORAL | Status: DC | PRN

## 2011-11-21 MED ORDER — ENOXAPARIN SODIUM 40 MG/0.4ML ~~LOC~~ SOLN
30.0000 mg | SUBCUTANEOUS | Status: AC
  Administered 2011-11-21: 30 mg via SUBCUTANEOUS

## 2011-11-21 MED ORDER — OXYCODONE-ACETAMINOPHEN 5-325 MG PO TABS: 1.0000 | ORAL_TABLET | ORAL | Status: DC | PRN

## 2011-11-21 MED ORDER — ATORVASTATIN CALCIUM 10 MG PO TABS
10.0000 mg | ORAL_TABLET | Freq: Every day | ORAL | Status: DC
  Administered 2011-11-22: 10 mg via ORAL
  Filled 2011-11-21 (×2): qty 1

## 2011-11-21 MED ORDER — TRIAMTERENE-HCTZ 75-50 MG PO TABS: 0.5000 | ORAL_TABLET | Freq: Every day | ORAL | Status: DC

## 2011-11-21 MED ORDER — HYDROMORPHONE 0.3 MG/ML IV SOLN
INTRAVENOUS | Status: AC
  Filled 2011-11-21: qty 25

## 2011-11-21 MED ORDER — MEMANTINE HCL 10 MG PO TABS
10.0000 mg | ORAL_TABLET | Freq: Two times a day (BID) | ORAL | Status: DC
  Administered 2011-11-21 – 2011-11-22 (×3): 10 mg via ORAL
  Filled 2011-11-21 (×4): qty 1

## 2011-11-21 MED ORDER — ONDANSETRON HCL 4 MG/2ML IJ SOLN
INTRAMUSCULAR | Status: DC | PRN
  Administered 2011-11-21: 4 mg via INTRAVENOUS

## 2011-11-21 MED ORDER — SUCCINYLCHOLINE CHLORIDE 20 MG/ML IJ SOLN
INTRAMUSCULAR | Status: DC | PRN
  Administered 2011-11-21: 60 mg via INTRAVENOUS

## 2011-11-21 MED ORDER — DIPHENHYDRAMINE HCL 12.5 MG/5ML PO ELIX: 12.5000 mg | ORAL_SOLUTION | Freq: Four times a day (QID) | ORAL | Status: DC | PRN

## 2011-11-21 MED ORDER — DIPHENHYDRAMINE HCL 50 MG/ML IJ SOLN: 12.5000 mg | Freq: Four times a day (QID) | INTRAMUSCULAR | Status: DC | PRN

## 2011-11-21 MED ORDER — LACTATED RINGERS IV SOLN
INTRAVENOUS | Status: DC
  Administered 2011-11-21 (×2): via INTRAVENOUS

## 2011-11-21 MED ORDER — ENOXAPARIN SODIUM 40 MG/0.4ML ~~LOC~~ SOLN
SUBCUTANEOUS | Status: AC
  Administered 2011-11-21: 30 mg via SUBCUTANEOUS
  Filled 2011-11-21: qty 0.4

## 2011-11-21 MED ORDER — ONDANSETRON HCL 4 MG PO TABS: 4.0000 mg | ORAL_TABLET | Freq: Four times a day (QID) | ORAL | Status: DC | PRN

## 2011-11-21 MED ORDER — SODIUM CHLORIDE 0.9 % IJ SOLN: 9.0000 mL | INTRAMUSCULAR | Status: DC | PRN

## 2011-11-21 MED ORDER — PROPOFOL 10 MG/ML IV BOLUS
INTRAVENOUS | Status: DC | PRN
  Administered 2011-11-21: 80 mg via INTRAVENOUS

## 2011-11-21 MED ORDER — FENTANYL CITRATE 0.05 MG/ML IJ SOLN
INTRAMUSCULAR | Status: DC | PRN
  Administered 2011-11-21 (×4): 50 ug via INTRAVENOUS

## 2011-11-21 MED ORDER — DONEPEZIL HCL 5 MG PO TABS
5.0000 mg | ORAL_TABLET | Freq: Every day | ORAL | Status: DC
  Administered 2011-11-21: 5 mg via ORAL
  Filled 2011-11-21 (×2): qty 1

## 2011-11-21 MED ORDER — KCL IN DEXTROSE-NACL 20-5-0.45 MEQ/L-%-% IV SOLN
INTRAVENOUS | Status: DC
  Administered 2011-11-21 – 2011-11-22 (×2): via INTRAVENOUS
  Filled 2011-11-21 (×3): qty 1000

## 2011-11-21 SURGICAL SUPPLY — 23 items
BLADE HEX COATED 2.75 (ELECTRODE) ×2 IMPLANT
CLOTH BEACON ORANGE TIMEOUT ST (SAFETY) ×2 IMPLANT
COVER MAYO STAND STRL (DRAPES) ×2 IMPLANT
DRAPE LG THREE QUARTER DISP (DRAPES) ×2 IMPLANT
GAUZE SPONGE 4X4 16PLY XRAY LF (GAUZE/BANDAGES/DRESSINGS) ×2 IMPLANT
GLOVE BIO SURGEON STRL SZ7.5 (GLOVE) ×6 IMPLANT
GOWN STRL REIN XL XLG (GOWN DISPOSABLE) ×6 IMPLANT
NEEDLE MAYO 6 CRC TAPER PT (NEEDLE) ×2 IMPLANT
NEEDLE SPNL 22GX3.5 QUINCKE BK (NEEDLE) ×2 IMPLANT
NS IRRIG 1000ML POUR BTL (IV SOLUTION) ×2 IMPLANT
PACK ABDOMINAL WL (CUSTOM PROCEDURE TRAY) ×2 IMPLANT
PACK LITHOTOMY IV (CUSTOM PROCEDURE TRAY) IMPLANT
SLEEVE SURGEON STRL (DRAPES) ×2 IMPLANT
SPONGE LAP 18X18 X RAY DECT (DISPOSABLE) IMPLANT
SUT VIC AB 0 CT1 36 (SUTURE) ×6 IMPLANT
SUT VIC AB 2-0 CT2 27 (SUTURE) ×28 IMPLANT
SUT VICRYL 2 0 18  UND BR (SUTURE) ×1
SUT VICRYL 2 0 18 UND BR (SUTURE) ×1 IMPLANT
SYR 5ML LL (SYRINGE) ×2 IMPLANT
SYR CONTROL 10ML LL (SYRINGE) ×2 IMPLANT
TOWEL OR 17X26 10 PK STRL BLUE (TOWEL DISPOSABLE) ×2 IMPLANT
TOWEL OR NON WOVEN STRL DISP B (DISPOSABLE) ×2 IMPLANT
TRAY FOLEY CATH 14FRSI W/METER (CATHETERS) ×2 IMPLANT

## 2011-11-21 NOTE — H&P (View-Only) (Signed)
Consult Note: Gyn-Onc   Shelley Adams 78 y.o. female  Chief Complaint  Patient presents with  . Endo ca    Follow up    Interval History: The patient returns with her daughter to discuss surgical management of her endometrial cancer. At her first visit was recommended she undergo a palliative vaginal hysterectomy. The patient's daughters have to consider this option and are now in agreement and wish to go ahead and schedule surgery. The patient has done well. She has some vaginal spotting at the present time but no heavy bleeding apparently she has no other complaints.  HPI:HPI: This is a lovely 78 year old reported approximately 4 months of vaginal bleeding. 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 The patient has severe dementia and additional history is not obtainable. Her medical records are notable for prior bilateral salpingo-oophorectomy however there is no pathology report available for confirmation and the patient and her daughter not aware of this procedure      Review of Systems:10 point review of systems is negative as noted above.   Vitals: Blood pressure 110/58, pulse 68, temperature 97.9 F (36.6 C), temperature source Oral, resp. rate 16, height 5' 3.11" (1.603 m), weight 90 lb 12.8 oz (41.187 kg).  Physical Exam: General : The patient is a healthy woman in no acute distress.  HEENT: normocephalic, extraoccular movements normal; neck is supple without thyromegally  Lynphnodes: Supraclavicular and inguinal nodes not enlarged  Abdomen: Soft, non-tender, no ascites, no organomegally, no masses, no hernias. She has a well-healed low midline incision Pelvic:  EGBUS: Normal female  Vagina: Normal, no lesions  Urethra and Bladder: Normal, non-tender  Cervix appears normal. There's a small amount of blood at the cervical os Uterus: Anterior normal shape size and consistency. There is moderate descensus in using a tenaculum.    Bi-manual examination: Non-tender; no adenxal masses or nodularity  Rectal: normal sphincter tone, no masses, no blood  Lower extremities: No edema or varicosities. 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 The patient's daughter understands that the standard treatment would be to perform a hysterectomy bilateral salpingo-vitrectomy surgical staging. The patient's family, however, feels very strongly that they do not wish to have extensive surgery and was certainly not consider chemotherapy. They understand that performing a hysterectomy would be intended to remove the uterus and thereby stop the patient's vaginal bleeding and possibly slowdown the time course to progression. They're aware of the risks of surgery including hemorrhage infection injury to adjacent viscera thromboembolic complications. They are also aware that I may need to convert to an abdominal hysterectomy if not able to accomplish the surgery vaginally. Further, there where the because of her prior pelvic surgery there may be adhesions to small bowel necessitating an open procedure and possible injury to small bowel. All her questions are answered No Known Allergies  Past Medical History  Diagnosis Date  . Hypertension   . Hyperlipidemia     history of  . Osteopenia   . Diverticulosis     history of  . Dementia   . Cancer     endometrial    Past Surgical History  Procedure Date  . Cataract extraction, bilateral   . Oophorectomy     left  . Tonsillectomy     Current Outpatient Prescriptions  Medication Sig Dispense Refill  . atorvastatin (LIPITOR) 10 MG tablet Take 10 mg by mouth daily with breakfast.      .  donepezil (ARICEPT) 5 MG tablet Take 5 mg by mouth at bedtime.      . memantine (NAMENDA) 10 MG tablet Take 10 mg by mouth 2 (two) times daily.      . Multiple Vitamin (MULTIVITAMIN WITH MINERALS) TABS Take 1 tablet by mouth daily with  breakfast.      . triamterene-hydrochlorothiazide (MAXZIDE) 75-50 MG per tablet Take 0.5 tablets by mouth daily with breakfast.        History   Social History  . Marital Status: Widowed    Spouse Name: N/A    Number of Children: N/A  . Years of Education: N/A   Occupational History  . Not on file.   Social History Main Topics  . Smoking status: Never Smoker   . Smokeless tobacco: Not on file  . Alcohol Use: No  . Drug Use:   . Sexually Active:    Other Topics Concern  . Not on file   Social History Narrative  . No narrative on file    Family History  Problem Relation Age of Onset  . Coronary artery disease Father   . Transient ischemic attack Father   . Heart attack Brother   . Parkinsonism Sister       Jeannette Corpus, MD 11/17/2011, 5:21 PM

## 2011-11-21 NOTE — Interval H&P Note (Signed)
History and Physical Interval Note:  11/21/2011 9:18 AM  Shelley Adams  has presented today for surgery, with the diagnosis of uterine cancer  The various methods of treatment have been discussed with the patient and family. After consideration of risks, benefits and other options for treatment, the patient has consented to  Procedure(s) (LRB): HYSTERECTOMY VAGINAL (N/A) as a surgical intervention .  The patient's history has been reviewed, patient examined, no change in status, stable for surgery.  I have reviewed the patients' chart and labs.  Questions were answered to the patient's satisfaction.     CLARKE-PEARSON,Shawna Kiener L

## 2011-11-21 NOTE — Preoperative (Signed)
Beta Blockers   Reason not to administer Beta Blockers:Not Applicable 

## 2011-11-21 NOTE — Op Note (Signed)
Shelley Adams  female MEDICAL RECORD HQ:[id number] DATE OF BIRTH: 1922-07-28 PHYSICIAN: De Blanch, M.D  DATE OF PROCEDURE: 07/02 2013:  OPERATIVE REPORT  PREOPERATIVE DIAGNOSIS: Papillary serous endometrial cancer  POSTOPERATIVE DIAGNOSIS: Same  PROCEDURE: Vaginal hysterectomy  SURGEON: De Blanch, M.D ASSISTANT: Antionette Char M.D., Telford Nab RN ANESTHESIA: Gen. orotracheal tube ESTIMATED BLOOD LOSS: 50 mL  SURGICAL FINDINGS: The uterus was small with some small uterine fibroids. The right ovary was high on the pelvic sidewall and we were unable to remove it. Preoperative discussion with the patient's daughter included understanding that we may not remove the ovaries. The left ovary could not be found. (We presumed was previously removed)  PROCEDURE: The patient was brought to the operating room and after satisfactory attainment of general anesthesia was placed in lithotomy position in [place] stirrups. The vagina and vulva were prepped and the patient was draped. A Foley catheter was inserted sterilely. A timeout was taken. Antibiotics were administered. Weighted speculum was placed in the posterior vagina and the cervix grasped with Lahey tenaculums at the vagina at the junction of the cervix was injected with 1% Xylocaine with epinephrine. A circumferential incision was made at the cervicovaginal junction. The bladder was advanced from the cervix with sharp dissection. The posterior cul-de-sac was entered through a posterior colpotomy and a suture was placed in the midline at 6:00. The uterosacral ligaments were clamped cut and suture ligated and held. Cardinal ligaments and uterine vessels were clamped cut and suture ligated. The uterine fundus was delivered posteriorly and the upper pedicles were crossclamped divided and suture ligated and free tied. Uterosacral ligaments were sutured back into the vaginal cuff. The vagina was closed incorporating the  vaginal mucosa pelvic peritoneum pelvic peritoneum and vaginal mucosa with interrupted sutures of 2-0 Vicryl. The sutures including the uterosacral ligaments were then tied elevating the vaginal angles. The vagina was irrigated copiously and the vaginal cuff reinspected and found to be hemostatic.  The patient was awakened from anesthesia and taken to the recovery room in satisfactory condition, sponge needle and instrument counts correct x2   De Blanch, M.D

## 2011-11-21 NOTE — Transfer of Care (Signed)
Immediate Anesthesia Transfer of Care Note  Patient: Shelley Adams  Procedure(s) Performed: Procedure(s) (LRB): HYSTERECTOMY VAGINAL (N/A)  Patient Location: PACU  Anesthesia Type: General  Level of Consciousness: awake, alert , oriented, patient cooperative and responds to stimulation  Airway & Oxygen Therapy: Patient Spontanous Breathing and Patient connected to face mask oxygen  Post-op Assessment: Report given to PACU RN, Post -op Vital signs reviewed and stable and Patient moving all extremities X 4  Post vital signs: Reviewed and stable  Complications: No apparent anesthesia complications

## 2011-11-21 NOTE — Anesthesia Preprocedure Evaluation (Addendum)
Anesthesia Evaluation  Patient identified by MRN, date of birth, ID band Patient awake    Reviewed: Allergy & Precautions, H&P , NPO status , Patient's Chart, lab work & pertinent test results  Airway Mallampati: II TM Distance: >3 FB Neck ROM: Full    Dental No notable dental hx.    Pulmonary neg pulmonary ROS,  CXR: hyperinflation. breath sounds clear to auscultation  Pulmonary exam normal       Cardiovascular Exercise Tolerance: Good hypertension, Pt. on medications Rhythm:Regular Rate:Normal  ECG: NSR   Neuro/Psych PSYCHIATRIC DISORDERS negative neurological ROS     GI/Hepatic negative GI ROS, Neg liver ROS,   Endo/Other  negative endocrine ROS  Renal/GU negative Renal ROS  negative genitourinary   Musculoskeletal negative musculoskeletal ROS (+)   Abdominal   Peds negative pediatric ROS (+)  Hematology negative hematology ROS (+)   Anesthesia Other Findings   Reproductive/Obstetrics negative OB ROS                         Anesthesia Physical Anesthesia Plan  ASA: III  Anesthesia Plan: General   Post-op Pain Management:    Induction: Intravenous  Airway Management Planned: Oral ETT  Additional Equipment:   Intra-op Plan:   Post-operative Plan: Extubation in OR  Informed Consent: I have reviewed the patients History and Physical, chart, labs and discussed the procedure including the risks, benefits and alternatives for the proposed anesthesia with the patient or authorized representative who has indicated his/her understanding and acceptance.   Dental advisory given  Plan Discussed with: CRNA  Anesthesia Plan Comments: (Discussed with patient and daughter r/b including possible worsening of dementia.)       Anesthesia Quick Evaluation

## 2011-11-21 NOTE — Anesthesia Postprocedure Evaluation (Signed)
  Anesthesia Post-op Note  Patient: Shelley Adams  Procedure(s) Performed: Procedure(s) (LRB): HYSTERECTOMY VAGINAL (N/A)  Patient Location: PACU  Anesthesia Type: General  Level of Consciousness: awake and alert   Airway and Oxygen Therapy: Patient Spontanous Breathing  Post-op Pain: mild  Post-op Assessment: Post-op Vital signs reviewed, Patient's Cardiovascular Status Stable, Respiratory Function Stable, Patent Airway and No signs of Nausea or vomiting  Post-op Vital Signs: stable  Complications: No apparent anesthesia complications

## 2011-11-22 ENCOUNTER — Encounter (HOSPITAL_COMMUNITY): Payer: Self-pay | Admitting: Gynecology

## 2011-11-22 LAB — BASIC METABOLIC PANEL WITH GFR
BUN: 11 mg/dL (ref 6–23)
CO2: 27 meq/L (ref 19–32)
Calcium: 8.8 mg/dL (ref 8.4–10.5)
Chloride: 105 meq/L (ref 96–112)
Creatinine, Ser: 0.67 mg/dL (ref 0.50–1.10)
GFR calc Af Amer: 89 mL/min — ABNORMAL LOW
GFR calc non Af Amer: 77 mL/min — ABNORMAL LOW
Glucose, Bld: 123 mg/dL — ABNORMAL HIGH (ref 70–99)
Potassium: 3.5 meq/L (ref 3.5–5.1)
Sodium: 138 meq/L (ref 135–145)

## 2011-11-22 LAB — CBC
HCT: 35.5 % — ABNORMAL LOW (ref 36.0–46.0)
Hemoglobin: 11.9 g/dL — ABNORMAL LOW (ref 12.0–15.0)
MCH: 29.1 pg (ref 26.0–34.0)
MCHC: 33.5 g/dL (ref 30.0–36.0)
MCV: 86.8 fL (ref 78.0–100.0)
Platelets: 192 K/uL (ref 150–400)
RBC: 4.09 MIL/uL (ref 3.87–5.11)
RDW: 12.9 % (ref 11.5–15.5)
WBC: 8.8 K/uL (ref 4.0–10.5)

## 2011-11-22 MED ORDER — ENOXAPARIN SODIUM 30 MG/0.3ML ~~LOC~~ SOLN
30.0000 mg | SUBCUTANEOUS | Status: DC
  Administered 2011-11-22: 30 mg via SUBCUTANEOUS
  Filled 2011-11-22: qty 0.3

## 2011-11-22 MED ORDER — ENOXAPARIN SODIUM 30 MG/0.3ML ~~LOC~~ SOLN: 30.0000 mg | Freq: Every day | SUBCUTANEOUS | Status: DC

## 2011-11-22 MED ORDER — ENSURE COMPLETE PO LIQD: 237.0000 mL | Freq: Three times a day (TID) | ORAL | Status: DC

## 2011-11-22 MED ORDER — OXYCODONE-ACETAMINOPHEN 5-325 MG PO TABS: 1.0000 | ORAL_TABLET | Freq: Four times a day (QID) | ORAL | Status: AC | PRN

## 2011-11-22 MED ORDER — ENOXAPARIN SODIUM 40 MG/0.4ML ~~LOC~~ SOLN: 40.0000 mg | Freq: Every day | SUBCUTANEOUS | Status: DC

## 2011-11-22 MED ORDER — ENOXAPARIN (LOVENOX) PATIENT EDUCATION KIT
PACK | Freq: Once | Status: AC
  Administered 2011-11-22: 10:00:00
  Filled 2011-11-22: qty 1

## 2011-11-22 MED ORDER — ENOXAPARIN SODIUM 40 MG/0.4ML ~~LOC~~ SOLN
40.0000 mg | SUBCUTANEOUS | Status: DC
  Filled 2011-11-22: qty 0.4

## 2011-11-22 NOTE — Discharge Summary (Signed)
Physician Discharge Summary  Patient ID: Shelley Adams MRN: [id number] DOB/AGE: Mar 10, 1923 78 y.o.  Admit date: 11/21/2011 Discharge date: 11/22/2011  Admission Diagnoses: Endometrial cancer  Discharge Diagnoses:  Principal Problem:  *Endometrial cancer  Discharged Condition: good  Hospital Course: On 11/21/2011, the patient underwent the following: Procedure(s): HYSTERECTOMY VAGINAL.   The postoperative course was uneventful.  She was discharged to home on postoperative day 1 tolerating a regular diet.  Consults: None  Significant Diagnostic Studies: None  Treatments: surgery: See above  Discharge Exam: Blood pressure 112/68, pulse 62, temperature 99.9 F (37.7 C), temperature source Oral, resp. rate 23, height 5\' 4"  (1.626 m), weight 88 lb 2.9 oz (40 kg), SpO2 95.00%. General appearance: alert, cooperative and no distress Resp: clear to auscultation bilaterally Cardio: regular rate and rhythm, S1, S2 normal, no murmur, click, rub or gallop GI: soft, non-tender; bowel sounds normal; no masses,  no organomegaly Extremities: extremities normal, atraumatic, no cyanosis or edema  Disposition: Final discharge disposition not confirmed  Discharge Orders    Future Orders Please Complete By Expires   Diet - low sodium heart healthy      Increase activity slowly      Driving Restrictions      Comments:   Do not take narcotics and drive.   Lifting restrictions      Comments:   No lifting greater than 30 lbs.   Sexual Activity Restrictions      Comments:   No sexual activity for 8 weeks.   Call MD for:  temperature >100.4      Call MD for:  persistant nausea and vomiting      Call MD for:  severe uncontrolled pain      Call MD for:  redness, tenderness, or signs of infection (pain, swelling, redness, odor or green/yellow discharge around incision site)      Call MD for:  difficulty breathing, headache or visual disturbances      Call MD for:  hives      Call MD for:   persistant dizziness or light-headedness      Call MD for:  extreme fatigue      Discharge instructions      Comments:   Teach Lovenox injections to the family prior to discharge     Medication List  As of 11/22/2011  8:51 AM   TAKE these medications         atorvastatin 10 MG tablet   Commonly known as: LIPITOR   Take 10 mg by mouth daily with breakfast.      donepezil 5 MG tablet   Commonly known as: ARICEPT   Take 5 mg by mouth at bedtime.      enoxaparin 40 MG/0.4ML injection   Commonly known as: LOVENOX   Inject 0.4 mLs (40 mg total) into the skin daily.      memantine 10 MG tablet   Commonly known as: NAMENDA   Take 10 mg by mouth 2 (two) times daily.      multivitamin with minerals Tabs   Take 1 tablet by mouth daily with breakfast.      oxyCODONE-acetaminophen 5-325 MG per tablet   Commonly known as: PERCOCET   Take 1 tablet by mouth every 6 (six) hours as needed (moderate to severe pain (when tolerating fluids)).      triamterene-hydrochlorothiazide 75-50 MG per tablet   Commonly known as: MAXZIDE   Take 0.5 tablets by mouth daily with breakfast.  Follow-up Information    Please follow up. (Call Telford Nab for follow up [phone])         Signed: CROSS, MELISSA DEAL 11/22/2011, 8:51 AM

## 2011-11-22 NOTE — Progress Notes (Signed)
Discussed steps for giving Lovenox injection with both daughters.  Talk one through giving the injection to pt, was able to demonstrate injection.  Both daughters were able to verbalize steps for giving injection.  Pt discharged, stable, left by wc.  Pt left Lovenox kit at bedside.  Pt and family contacted.

## 2012-01-12 ENCOUNTER — Ambulatory Visit: Payer: Medicare Other | Attending: Gynecology | Admitting: Gynecology

## 2012-01-12 ENCOUNTER — Encounter: Payer: Self-pay | Admitting: Gynecology

## 2012-01-12 VITALS — BP 110/56 | HR 62 | Temp 98.2°F | Resp 16 | Ht 63.11 in | Wt 92.4 lb

## 2012-01-12 DIAGNOSIS — C549 Malignant neoplasm of corpus uteri, unspecified: Secondary | ICD-10-CM | POA: Insufficient documentation

## 2012-01-12 DIAGNOSIS — Z79899 Other long term (current) drug therapy: Secondary | ICD-10-CM | POA: Insufficient documentation

## 2012-01-12 DIAGNOSIS — M949 Disorder of cartilage, unspecified: Secondary | ICD-10-CM | POA: Insufficient documentation

## 2012-01-12 DIAGNOSIS — C541 Malignant neoplasm of endometrium: Secondary | ICD-10-CM

## 2012-01-12 DIAGNOSIS — I10 Essential (primary) hypertension: Secondary | ICD-10-CM | POA: Insufficient documentation

## 2012-01-12 DIAGNOSIS — M899 Disorder of bone, unspecified: Secondary | ICD-10-CM | POA: Insufficient documentation

## 2012-01-12 NOTE — Progress Notes (Signed)
Consult Note: Gyn-Onc   Shelley Adams 78 y.o. female  Chief Complaint  Patient presents with  . Endometrial cancer    Follow up    Interval History: The patient returns today for initial postoperative followup. She underwent a vaginal hysterectomy on 11/21/2011. Final pathology showed a high-grade endometrial carcinoma with papillary serous and her cell differentiation measuring 3 cm in diameter. The tumor invaded less than 50% of the myometrium and no lymph vascular space involvement was noted. The patient's had an entirely uncomplicated postoperative course according to her daughter who accompanies her today.  She specifically denies any GI or GU symptoms and has had no vaginal bleeding.  Review of Systems:10 point review of systems is negative as noted above.   Vitals: Blood pressure 110/56, pulse 62, temperature 98.2 F (36.8 C), temperature source Oral, resp. rate 16, height 5' 3.11" (1.603 m), weight 92 lb 6.4 oz (41.912 kg).  Physical Exam: General : The patient is a healthy woman in no acute distress.  HEENT: normocephalic, extraoccular movements normal; neck is supple without thyromegally  Lynphnodes: Supraclavicular and inguinal nodes not enlarged  Abdomen: Soft, non-tender, no ascites, no organomegally, no masses, no hernias  Pelvic:  EGBUS: Normal female  Vagina: Normal, no lesions the vaginal cuff is well-healed. Urethra and Bladder: Normal, non-tender  Cervix: Surgically absent  Uterus: Surgically absent  Bi-manual examination: Non-tender; no adenxal masses or nodularity  Rectal: normal sphincter tone, no masses, no blood  Lower extremities: No edema or varicosities. 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 I had a lengthy discussion the patient's daughter regarding these findings and the high risk for recurrence. Prior to surgery we agreed that no further therapy beyond surgery  would be entertained given the patient's dementia. I did inform the daughter that under normal circumstances in the patient who wanted to be treated aggressively, postoperative chemotherapy would be advised.  The patient's daughter is been given information about warning signs of recurrence. We will not scheduled return appointments to this office but are available should problems arise. The patient given the okay to return to full levels of activity.  No Known Allergies  Past Medical History  Diagnosis Date  . Hypertension   . Hyperlipidemia     history of  . Osteopenia   . Diverticulosis     history of  . Dementia   . Cancer     endometrial    Past Surgical History  Procedure Date  . Cataract extraction, bilateral   . Oophorectomy     left  . Tonsillectomy   . Vaginal hysterectomy 11/21/2011    Procedure: HYSTERECTOMY VAGINAL;  Surgeon: Jeannette Corpus, MD;  Location: [facility];  Service: Gynecology;  Laterality: N/A;    Current Outpatient Prescriptions  Medication Sig Dispense Refill  . atorvastatin (LIPITOR) 10 MG tablet Take 10 mg by mouth daily with breakfast.      . donepezil (ARICEPT) 5 MG tablet Take 5 mg by mouth at bedtime.      . memantine (NAMENDA) 10 MG tablet Take 10 mg by mouth 2 (two) times daily.      . Multiple Vitamin (MULTIVITAMIN WITH MINERALS) TABS Take 1 tablet by mouth daily with breakfast.      . triamterene-hydrochlorothiazide (MAXZIDE) 75-50 MG per tablet Take 0.5 tablets by mouth daily with breakfast.      . enoxaparin (LOVENOX) 30 MG/0.3ML injection Inject 0.3 mLs (  30 mg total) into the skin daily.  20 Syringe  0    History   Social History  . Marital Status: Widowed    Spouse Name: N/A    Number of Children: N/A  . Years of Education: N/A   Occupational History  . Not on file.   Social History Main Topics  . Smoking status: Never Smoker   . Smokeless tobacco: Not on file  . Alcohol Use: No  . Drug Use:   . Sexually Active:     Other Topics Concern  . Not on file   Social History Narrative  . No narrative on file    Family History  Problem Relation Age of Onset  . Coronary artery disease Father   . Transient ischemic attack Father   . Heart attack Brother   . Parkinsonism Sister       Jeannette Corpus, MD 01/12/2012, 11:18 AM

## 2012-01-12 NOTE — Patient Instructions (Signed)
Please call if we can be of any help or answer any further questions

## 2012-01-18 ENCOUNTER — Other Ambulatory Visit: Payer: Self-pay | Admitting: Cardiology

## 2012-02-28 ENCOUNTER — Other Ambulatory Visit: Payer: Self-pay | Admitting: *Deleted

## 2012-02-28 MED ORDER — DONEPEZIL HCL 5 MG PO TABS: 5.0000 mg | ORAL_TABLET | Freq: Every day | ORAL | Status: DC

## 2012-03-28 ENCOUNTER — Other Ambulatory Visit (INDEPENDENT_AMBULATORY_CARE_PROVIDER_SITE_OTHER): Payer: Medicare Other

## 2012-03-28 DIAGNOSIS — I119 Hypertensive heart disease without heart failure: Secondary | ICD-10-CM

## 2012-03-28 DIAGNOSIS — E7800 Pure hypercholesterolemia, unspecified: Secondary | ICD-10-CM

## 2012-03-28 LAB — BASIC METABOLIC PANEL
BUN: 24 mg/dL — ABNORMAL HIGH (ref 6–23)
Calcium: 9.8 mg/dL (ref 8.4–10.5)
Creatinine, Ser: 0.7 mg/dL (ref 0.4–1.2)
GFR: 78.8 mL/min (ref >60.00)
Glucose, Bld: 85 mg/dL (ref 70–99)

## 2012-03-28 LAB — HEPATIC FUNCTION PANEL
ALT: 14 U/L (ref 0–35)
AST: 16 U/L (ref 0–37)
Alkaline Phosphatase: 75 U/L (ref 39–117)
Bilirubin, Direct: 0.3 mg/dL (ref 0.0–0.3)
Total Bilirubin: 1.8 mg/dL — ABNORMAL HIGH (ref 0.3–1.2)
Total Protein: 7.3 g/dL (ref 6.0–8.3)

## 2012-03-28 LAB — LIPID PANEL
Cholesterol: 173 mg/dL (ref 0–200)
VLDL: 34.4 mg/dL (ref 0.0–40.0)

## 2012-03-28 NOTE — Progress Notes (Signed)
Quick Note:  Please make copy of labs for patient visit. ______ 

## 2012-04-03 ENCOUNTER — Ambulatory Visit (INDEPENDENT_AMBULATORY_CARE_PROVIDER_SITE_OTHER): Payer: Medicare Other | Admitting: Cardiology

## 2012-04-03 ENCOUNTER — Encounter: Payer: Self-pay | Admitting: Cardiology

## 2012-04-03 VITALS — BP 123/76 | HR 88 | Ht 63.5 in | Wt 89.2 lb

## 2012-04-03 DIAGNOSIS — E7800 Pure hypercholesterolemia, unspecified: Secondary | ICD-10-CM

## 2012-04-03 DIAGNOSIS — F0390 Unspecified dementia without behavioral disturbance: Secondary | ICD-10-CM

## 2012-04-03 DIAGNOSIS — I119 Hypertensive heart disease without heart failure: Secondary | ICD-10-CM

## 2012-04-03 NOTE — Patient Instructions (Addendum)
Please encourage her to drink more fluids   Your physician wants you to follow-up in: 6 months  You will receive a reminder letter in the mail two months in advance. If you don't receive a letter, please call our office to schedule the follow-up appointment.  Your physician recommends that you return for lab work in: 6 months - lp/hfp/bmet/cbc/tsh

## 2012-04-03 NOTE — Assessment & Plan Note (Signed)
Blood pressure is stable on current therapy.  He denies any dyspnea chest pain or dizziness or syncope

## 2012-04-03 NOTE — Assessment & Plan Note (Signed)
The patient is on Lipitor 10 mg daily.  We reviewed her recent labs which are satisfactory and her LDL cholesterol is 91

## 2012-04-03 NOTE — Progress Notes (Signed)
Shelley Adams Date of Birth:  11/26/1922 [facility] [address] [place], [place]  [address] [phone]         Fax   [phone]  History of Present Illness: This pleasant 78 year old widowed Caucasian female is seen for a six-month followup office visit. She has a past history of essential hypertension and hypercholesterolemia. She also has severe dementia. She has had progressive weight loss due to not eating enough food.  She had had previous vaginal bleeding and was found to have endometrial cancer and since her last visit here she has had a successful hysterectomy for endometrial cancer.   Current Outpatient Prescriptions  Medication Sig Dispense Refill  . donepezil (ARICEPT) 5 MG tablet Take 1 tablet (5 mg total) by mouth at bedtime.  30 tablet  5  . LIPITOR 10 MG tablet TAKE 1 TABLET ONCE DAILY.  30 each  6  . memantine (NAMENDA) 10 MG tablet Take 10 mg by mouth 2 (two) times daily.      . Multiple Vitamin (MULTIVITAMIN WITH MINERALS) TABS Take 1 tablet by mouth daily with breakfast.      . triamterene-hydrochlorothiazide (MAXZIDE) 75-50 MG per tablet Take 0.5 tablets by mouth daily with breakfast.        No Known Allergies  Patient Active Problem List  Diagnosis  . Dementia  . Hypercholesterolemia  . Benign hypertensive heart disease without heart failure  . Endometrial cancer    History  Smoking status  . Never Smoker   Smokeless tobacco  . Not on file    History  Alcohol Use No    Family History  Problem Relation Age of Onset  . Coronary artery disease Father   . Transient ischemic attack Father   . Heart attack Brother   . Parkinsonism Sister     Review of Systems: Constitutional: no fever chills diaphoresis or fatigue or change in weight.  Head and neck: no hearing loss, no epistaxis, no photophobia or visual disturbance. Respiratory: No cough, shortness of breath or wheezing. Cardiovascular: No chest pain peripheral  edema, palpitations. Gastrointestinal: No abdominal distention, no abdominal pain, no change in bowel habits hematochezia or melena. Genitourinary: No dysuria, no frequency, no urgency, no nocturia. Musculoskeletal:No arthralgias, no back pain, no gait disturbance or myalgias. Neurological: No dizziness, no headaches, no numbness, no seizures, no syncope, no weakness, no tremors. Hematologic: No lymphadenopathy, no easy bruising. Psychiatric: No confusion, no hallucinations, no sleep disturbance.    Physical Exam: Filed Vitals:   04/03/12 1109  BP: 123/76  Pulse: 88   the general appearance reveals a very pleasant elderly woman who has short-term memory loss.she is very thin The head and neck exam reveals pupils equal and reactive.  Extraocular movements are full.  There is no scleral icterus.  The mouth and pharynx are normal.  The neck is supple.  The carotids reveal no bruits.  The jugular venous pressure is normal.  The  thyroid is not enlarged.  There is no lymphadenopathy.  The chest is clear to percussion and auscultation.  There are no rales or rhonchi.  Expansion of the chest is symmetrical.  The precordium is quiet.  The first heart sound is normal.  The second heart sound is physiologically split.  There is no murmur gallop rub or click.  There is no abnormal lift or heave.  The abdomen is soft and nontender.  The bowel sounds are normal.  The liver and spleen are not enlarged.  There are  no abdominal masses.  There are no abdominal bruits.  Extremities reveal good pedal pulses.  There is no phlebitis or edema.  There is no cyanosis or clubbing.  Strength is normal and symmetrical in all extremities.  There is no lateralizing weakness.  There are no sensory deficits.  The skin is warm and dry.  There is no rash.     Assessment / Plan: Continue same medication.  Her BUN is higher and she needs to drink more free water.  Recheck in 6 months for followup office visit CBC TSH lipid panel  hepatic function panel and basal metabolic panel.

## 2012-04-03 NOTE — Assessment & Plan Note (Signed)
The patient has significant dementia but is still able to function and lives in her own home.  The family is very attentive and brings her food everyday but unfortunately the patient has a poor appetite and does not eat much.  Her weight is down 2 pounds since last visit

## 2012-06-07 ENCOUNTER — Other Ambulatory Visit: Payer: Self-pay

## 2012-06-07 MED ORDER — MEMANTINE HCL 10 MG PO TABS: 10.0000 mg | ORAL_TABLET | Freq: Two times a day (BID) | ORAL | Status: DC

## 2012-06-13 ENCOUNTER — Other Ambulatory Visit: Payer: Self-pay | Admitting: *Deleted

## 2012-06-14 ENCOUNTER — Other Ambulatory Visit: Payer: Self-pay

## 2012-06-14 MED ORDER — MEMANTINE HCL 10 MG PO TABS: 10.0000 mg | ORAL_TABLET | Freq: Two times a day (BID) | ORAL | Status: DC

## 2012-07-15 ENCOUNTER — Other Ambulatory Visit: Payer: Self-pay | Admitting: *Deleted

## 2012-07-15 MED ORDER — TRIAMTERENE-HCTZ 75-50 MG PO TABS: 0.5000 | ORAL_TABLET | Freq: Every day | ORAL | Status: DC

## 2012-09-12 ENCOUNTER — Emergency Department (HOSPITAL_COMMUNITY): Payer: Medicare Other

## 2012-09-12 ENCOUNTER — Telehealth: Payer: Self-pay | Admitting: *Deleted

## 2012-09-12 ENCOUNTER — Encounter (HOSPITAL_COMMUNITY): Payer: Self-pay | Admitting: Emergency Medicine

## 2012-09-12 ENCOUNTER — Emergency Department (HOSPITAL_COMMUNITY)
Admission: EM | Admit: 2012-09-12 | Discharge: 2012-09-12 | Disposition: A | Discharge status: Home / Self Care | Payer: Medicare Other | Attending: Emergency Medicine | Admitting: Emergency Medicine

## 2012-09-12 DIAGNOSIS — Z8739 Personal history of other diseases of the musculoskeletal system and connective tissue: Secondary | ICD-10-CM | POA: Insufficient documentation

## 2012-09-12 DIAGNOSIS — Z8719 Personal history of other diseases of the digestive system: Secondary | ICD-10-CM | POA: Insufficient documentation

## 2012-09-12 DIAGNOSIS — Z8542 Personal history of malignant neoplasm of other parts of uterus: Secondary | ICD-10-CM | POA: Insufficient documentation

## 2012-09-12 DIAGNOSIS — E785 Hyperlipidemia, unspecified: Secondary | ICD-10-CM | POA: Insufficient documentation

## 2012-09-12 DIAGNOSIS — Z79899 Other long term (current) drug therapy: Secondary | ICD-10-CM | POA: Insufficient documentation

## 2012-09-12 DIAGNOSIS — S0100XA Unspecified open wound of scalp, initial encounter: Secondary | ICD-10-CM | POA: Insufficient documentation

## 2012-09-12 DIAGNOSIS — F0390 Unspecified dementia without behavioral disturbance: Secondary | ICD-10-CM | POA: Insufficient documentation

## 2012-09-12 DIAGNOSIS — I10 Essential (primary) hypertension: Secondary | ICD-10-CM | POA: Insufficient documentation

## 2012-09-12 DIAGNOSIS — Y9289 Other specified places as the place of occurrence of the external cause: Secondary | ICD-10-CM | POA: Insufficient documentation

## 2012-09-12 DIAGNOSIS — Y939 Activity, unspecified: Secondary | ICD-10-CM | POA: Insufficient documentation

## 2012-09-12 DIAGNOSIS — W1809XA Striking against other object with subsequent fall, initial encounter: Secondary | ICD-10-CM | POA: Insufficient documentation

## 2012-09-12 DIAGNOSIS — S0990XA Unspecified injury of head, initial encounter: Secondary | ICD-10-CM

## 2012-09-12 DIAGNOSIS — Z23 Encounter for immunization: Secondary | ICD-10-CM | POA: Insufficient documentation

## 2012-09-12 MED ORDER — TETANUS-DIPHTH-ACELL PERTUSSIS 5-2.5-18.5 LF-MCG/0.5 IM SUSP
0.5000 mL | Freq: Once | INTRAMUSCULAR | Status: AC
  Administered 2012-09-12: 0.5 mL via INTRAMUSCULAR
  Filled 2012-09-12: qty 0.5

## 2012-09-12 NOTE — ED Notes (Signed)
Per report from daughter, pt was found with a bloody pillow and sweater. Pt has dementia and deny's  Falling at this time

## 2012-09-12 NOTE — ED Provider Notes (Signed)
History     CSN: [id number]  Arrival date & time 09/12/12  1032   First MD Initiated Contact with Patient 09/12/12 1132      Chief Complaint  Patient presents with  . Head Laceration    (Consider location/radiation/quality/duration/timing/severity/associated sxs/prior treatment) Patient is a 79 y.o. female presenting with scalp laceration. The history is provided by a relative (daughter states the pt has been bleeding from her head  she believes the pt hit her head.  the pt has a hx of dementia). No language interpreter was used.  Head Laceration This is a new problem. The current episode started 6 to 12 hours ago. The problem occurs constantly. The problem has not changed since onset.Pertinent negatives include no chest pain, no abdominal pain and no headaches. Nothing aggravates the symptoms. Nothing relieves the symptoms.    Past Medical History  Diagnosis Date  . Hypertension   . Hyperlipidemia     history of  . Osteopenia   . Diverticulosis     history of  . Dementia   . Cancer     endometrial    Past Surgical History  Procedure Laterality Date  . Cataract extraction, bilateral    . Oophorectomy      left  . Tonsillectomy    . Vaginal hysterectomy  11/21/2011    Procedure: HYSTERECTOMY VAGINAL;  Surgeon: Jeannette Corpus, MD;  Location: [facility];  Service: Gynecology;  Laterality: N/A;    Family History  Problem Relation Age of Onset  . Coronary artery disease Father   . Transient ischemic attack Father   . Heart attack Brother   . Parkinsonism Sister     History  Substance Use Topics  . Smoking status: Never Smoker   . Smokeless tobacco: Not on file  . Alcohol Use: No    OB History   Grav Para Term Preterm Abortions TAB SAB Ect Mult Living                  Review of Systems  Constitutional: Negative for appetite change and fatigue.  HENT: Negative for congestion, sinus pressure and ear discharge.        Bleeding from head  Eyes: Negative  for discharge.  Respiratory: Negative for cough.   Cardiovascular: Negative for chest pain.  Gastrointestinal: Negative for abdominal pain and diarrhea.  Genitourinary: Negative for frequency and hematuria.  Musculoskeletal: Negative for back pain.  Skin: Negative for rash.  Neurological: Negative for seizures and headaches.  Psychiatric/Behavioral: Negative for hallucinations.    Allergies  Review of patient's allergies indicates no known allergies.  Home Medications   Current Outpatient Rx  Name  Route  Sig  Dispense  Refill  . atorvastatin (LIPITOR) 10 MG tablet   Oral   Take 10 mg by mouth daily.         Marland Kitchen donepezil (ARICEPT) 5 MG tablet   Oral   Take 5 mg by mouth daily.         . memantine (NAMENDA) 10 MG tablet   Oral   Take 1 tablet (10 mg total) by mouth 2 (two) times daily.   60 tablet   5   . Multiple Vitamin (MULTIVITAMIN WITH MINERALS) TABS   Oral   Take 1 tablet by mouth daily with breakfast.         . triamterene-hydrochlorothiazide (MAXZIDE) 75-50 MG per tablet   Oral   Take 0.5 tablets by mouth daily with breakfast.  BP 125/66  Pulse 84  Temp(Src) 97.9 F (36.6 C) (Oral)  Resp 16  SpO2 97%  Physical Exam  Constitutional: She appears well-developed.  HENT:  Head: Normocephalic.   1 cm lac. Occipital head  Eyes: Conjunctivae and EOM are normal. No scleral icterus.  Neck: Neck supple. No thyromegaly present.  Cardiovascular: Normal rate and regular rhythm.  Exam reveals no gallop and no friction rub.   No murmur heard. Pulmonary/Chest: No stridor. She has no wheezes. She has no rales. She exhibits no tenderness.  Abdominal: She exhibits no distension. There is no tenderness. There is no rebound.  Musculoskeletal: Normal range of motion. She exhibits no edema.  Lymphadenopathy:    She has no cervical adenopathy.  Neurological: She is alert. Coordination normal.  Skin: No rash noted. No erythema.  Psychiatric: She has a  normal mood and affect. Her behavior is normal.    ED Course  LACERATION REPAIR Date/Time: 09/12/2012 1:19 PM Performed by: Persis Graffius L Authorized by: Essynce Munsch L Comments: 1 cm lac to occipital head.  Cleaned with steril water.   3 staples used to close the lac.  Pt tolerated the procedure well   (including critical care time)  Labs Reviewed - No data to display Ct Head Wo Contrast  09/12/2012  *RADIOLOGY REPORT*  Clinical Data:  Head laceration.  Probable fall.  Dementia.  CT HEAD WITHOUT CONTRAST CT CERVICAL SPINE WITHOUT CONTRAST  Technique:  Multidetector CT imaging of the head and cervical spine was performed following the standard protocol without intravenous contrast.  Multiplanar CT image reconstructions of the cervical spine were also generated.  Comparison:  CT head without, 04/26/2009.  CT HEAD  Findings: A right occipital scalp hematoma laceration is present. There is no underlying fracture.  No acute cortical infarct, hemorrhage, mass lesion is present.  Moderate to generalized atrophy and diffuse white matter disease is similar to the prior exam.  The ventricles are proportionate to the degree of atrophy. No significant extra-axial fluid collection is present.  The paranasal sinuses and mastoid air cells are clear.  The osseous skull is intact.  IMPRESSION:  1.  Right occipital scalp hematoma and laceration without an underlying fracture. 2.  No acute intracranial abnormality. 3.  Moderate generalized atrophy white matter disease is similar to the prior exam, likely the sequelae of chronic microvascular ischemia.  CT CERVICAL SPINE  Findings: The cervical spine is imaged from skull base through T1- 2.  There is some reversal normal cervical lordosis.  Rightward curvature is evident.  There is severe loss of disc height and chronic end plate changes and uncovertebral spurring most evident at C4-5 and C6-7.  Disease is slightly more mild at C5-6.  Grade 1 anterolisthesis is  present at C3-4 with slight degenerative anterolisthesis.  No acute fracture or traumatic subluxation is evident.  The lung apices are clear.  The thyroid is heterogeneous without a dominant lesion.  IMPRESSION:  1.  Advanced spondylosis of the cervical spine as described. 2.  No acute fracture or traumatic subluxation.   Original Report Authenticated By: Marin Roberts, M.D.    Ct Cervical Spine Wo Contrast  09/12/2012  *RADIOLOGY REPORT*  Clinical Data:  Head laceration.  Probable fall.  Dementia.  CT HEAD WITHOUT CONTRAST CT CERVICAL SPINE WITHOUT CONTRAST  Technique:  Multidetector CT imaging of the head and cervical spine was performed following the standard protocol without intravenous contrast.  Multiplanar CT image reconstructions of the cervical spine were also generated.  Comparison:  CT head without, 04/26/2009.  CT HEAD  Findings: A right occipital scalp hematoma laceration is present. There is no underlying fracture.  No acute cortical infarct, hemorrhage, mass lesion is present.  Moderate to generalized atrophy and diffuse white matter disease is similar to the prior exam.  The ventricles are proportionate to the degree of atrophy. No significant extra-axial fluid collection is present.  The paranasal sinuses and mastoid air cells are clear.  The osseous skull is intact.  IMPRESSION:  1.  Right occipital scalp hematoma and laceration without an underlying fracture. 2.  No acute intracranial abnormality. 3.  Moderate generalized atrophy white matter disease is similar to the prior exam, likely the sequelae of chronic microvascular ischemia.  CT CERVICAL SPINE  Findings: The cervical spine is imaged from skull base through T1- 2.  There is some reversal normal cervical lordosis.  Rightward curvature is evident.  There is severe loss of disc height and chronic end plate changes and uncovertebral spurring most evident at C4-5 and C6-7.  Disease is slightly more mild at C5-6.  Grade 1  anterolisthesis is present at C3-4 with slight degenerative anterolisthesis.  No acute fracture or traumatic subluxation is evident.  The lung apices are clear.  The thyroid is heterogeneous without a dominant lesion.  IMPRESSION:  1.  Advanced spondylosis of the cervical spine as described. 2.  No acute fracture or traumatic subluxation.   Original Report Authenticated By: Marin Roberts, M.D.      1. Head injury, initial encounter       MDM         Benny Lennert, MD 09/12/12 1320

## 2012-09-12 NOTE — Telephone Encounter (Signed)
Patients daughter phoned stating that when she arrived at her mothers home she had dried blood all over her head and a decorative pillow, unable to determine where blood came from.  Advised  Dr. Patty Sermons out of the office and that patient needed to be evaluated. Per daughter she seemed to be feeling fine encouraged she still should be evaluated. Patient does not want to go to ED, advised to go to Urgent Care. Daughter verbalized understanding

## 2012-09-12 NOTE — ED Notes (Signed)
Bed:WA04<BR> Expected date:<BR> Expected time:<BR> Means of arrival:<BR> Comments:<BR>

## 2012-09-18 ENCOUNTER — Emergency Department (HOSPITAL_COMMUNITY)
Admission: EM | Admit: 2012-09-18 | Discharge: 2012-09-18 | Disposition: A | Discharge status: Home / Self Care | Payer: Medicare Other | Attending: Emergency Medicine | Admitting: Emergency Medicine

## 2012-09-18 ENCOUNTER — Encounter (HOSPITAL_COMMUNITY): Payer: Self-pay | Admitting: Emergency Medicine

## 2012-09-18 ENCOUNTER — Telehealth: Payer: Self-pay | Admitting: Cardiology

## 2012-09-18 DIAGNOSIS — F0390 Unspecified dementia without behavioral disturbance: Secondary | ICD-10-CM | POA: Insufficient documentation

## 2012-09-18 DIAGNOSIS — Z8719 Personal history of other diseases of the digestive system: Secondary | ICD-10-CM | POA: Insufficient documentation

## 2012-09-18 DIAGNOSIS — Z8639 Personal history of other endocrine, nutritional and metabolic disease: Secondary | ICD-10-CM | POA: Insufficient documentation

## 2012-09-18 DIAGNOSIS — Z862 Personal history of diseases of the blood and blood-forming organs and certain disorders involving the immune mechanism: Secondary | ICD-10-CM | POA: Insufficient documentation

## 2012-09-18 DIAGNOSIS — Z9849 Cataract extraction status, unspecified eye: Secondary | ICD-10-CM | POA: Insufficient documentation

## 2012-09-18 DIAGNOSIS — Z4802 Encounter for removal of sutures: Secondary | ICD-10-CM | POA: Insufficient documentation

## 2012-09-18 DIAGNOSIS — Z79899 Other long term (current) drug therapy: Secondary | ICD-10-CM | POA: Insufficient documentation

## 2012-09-18 DIAGNOSIS — M899 Disorder of bone, unspecified: Secondary | ICD-10-CM | POA: Insufficient documentation

## 2012-09-18 DIAGNOSIS — I10 Essential (primary) hypertension: Secondary | ICD-10-CM | POA: Insufficient documentation

## 2012-09-18 DIAGNOSIS — Z8540 Personal history of malignant neoplasm of unspecified female genital organ: Secondary | ICD-10-CM | POA: Insufficient documentation

## 2012-09-18 NOTE — Telephone Encounter (Signed)
New problem   Daughter has questions about pts injury in the back of her head

## 2012-09-18 NOTE — ED Provider Notes (Signed)
History     CSN: [id number]  Arrival date & time 09/18/12  1051   First MD Initiated Contact with Patient 09/18/12 1117      Chief Complaint  Patient presents with  . Suture / Staple Removal    (Consider location/radiation/quality/duration/timing/severity/associated sxs/prior treatment) HPI  79 year old female with history of dementia is here for staple removal. Patient injured her head and suffered a scalp laceration a week ago. She was evaluated and had 3 staples applied to the scalp. Family member report there has not been any specific issue.  Is here for staples removal.    Past Medical History  Diagnosis Date  . Hypertension   . Hyperlipidemia     history of  . Osteopenia   . Diverticulosis     history of  . Dementia   . Cancer     endometrial    Past Surgical History  Procedure Laterality Date  . Cataract extraction, bilateral    . Oophorectomy      left  . Tonsillectomy    . Vaginal hysterectomy  11/21/2011    Procedure: HYSTERECTOMY VAGINAL;  Surgeon: Jeannette Corpus, MD;  Location: [facility];  Service: Gynecology;  Laterality: N/A;    Family History  Problem Relation Age of Onset  . Coronary artery disease Father   . Transient ischemic attack Father   . Heart attack Brother   . Parkinsonism Sister     History  Substance Use Topics  . Smoking status: Never Smoker   . Smokeless tobacco: Not on file  . Alcohol Use: No    OB History   Grav Para Term Preterm Abortions TAB SAB Ect Mult Living                  Review of Systems  Constitutional: Negative for fever.  Skin: Positive for wound.  Neurological: Negative for numbness and headaches.    Allergies  Review of patient's allergies indicates no known allergies.  Home Medications   Current Outpatient Rx  Name  Route  Sig  Dispense  Refill  . atorvastatin (LIPITOR) 10 MG tablet   Oral   Take 10 mg by mouth daily.         Marland Kitchen donepezil (ARICEPT) 5 MG tablet   Oral   Take 5 mg by  mouth daily.         . memantine (NAMENDA) 10 MG tablet   Oral   Take 1 tablet (10 mg total) by mouth 2 (two) times daily.   60 tablet   5   . Multiple Vitamin (MULTIVITAMIN WITH MINERALS) TABS   Oral   Take 1 tablet by mouth daily with breakfast.         . triamterene-hydrochlorothiazide (MAXZIDE) 75-50 MG per tablet   Oral   Take 0.5 tablets by mouth daily with breakfast.           BP 119/70  Pulse 91  Temp(Src) 98.7 F (37.1 C) (Oral)  Resp 20  SpO2 93%  Physical Exam  Nursing note and vitals reviewed. Constitutional: She appears well-developed and well-nourished. No distress.  HENT:  Head: Normocephalic.  Well healing laceration to vertex of scalp with 3 staples in place.  Nontender, no signs of infection.    Eyes: Conjunctivae are normal.  Neck: Neck supple.  Neurological: She is alert.  Skin: No rash noted.    ED Course  Procedures (including critical care time)  SUTURE REMOVAL Performed by: Fayrene Helper  Consent: Verbal consent obtained. Consent  given by: patient Required items: required blood products, implants, devices, and special equipment available Time out: Immediately prior to procedure a "time out" was called to verify the correct patient, procedure, equipment, support staff and site/side marked as required.  Location: vertex of scalp  Wound Appearance: clean  Staples Removed: 3  Patient tolerance: Patient tolerated the procedure well with no immediate complications.     Labs Reviewed - No data to display No results found.   1. Encounter for removal of staples       MDM  BP 119/70  Pulse 91  Temp(Src) 98.7 F (37.1 C) (Oral)  Resp 20  SpO2 93%         Fayrene Helper, PA-C 09/18/12 1125

## 2012-09-18 NOTE — ED Notes (Signed)
Pt here for suture removal from the back of her head.  Does not have any issues.  They were put in last Wednesday.

## 2012-09-18 NOTE — Telephone Encounter (Signed)
Left message to call back  

## 2012-09-18 NOTE — ED Provider Notes (Signed)
Medical screening examination/treatment/procedure(s) were performed by non-physician practitioner and as supervising physician I was immediately available for consultation/collaboration.   Suzi Roots, MD 09/18/12 [phone]

## 2012-09-27 NOTE — Telephone Encounter (Signed)
Daughter never called back

## 2012-11-21 ENCOUNTER — Ambulatory Visit (INDEPENDENT_AMBULATORY_CARE_PROVIDER_SITE_OTHER): Payer: Medicare Other | Admitting: Cardiology

## 2012-11-21 ENCOUNTER — Encounter: Payer: Self-pay | Admitting: Cardiology

## 2012-11-21 VITALS — BP 120/68 | HR 75 | Ht 62.0 in | Wt 93.8 lb

## 2012-11-21 DIAGNOSIS — I119 Hypertensive heart disease without heart failure: Secondary | ICD-10-CM

## 2012-11-21 DIAGNOSIS — E7800 Pure hypercholesterolemia, unspecified: Secondary | ICD-10-CM

## 2012-11-21 DIAGNOSIS — R5381 Other malaise: Secondary | ICD-10-CM

## 2012-11-21 LAB — CBC WITH DIFFERENTIAL/PLATELET
Basophils Absolute: 0 10*3/uL (ref 0.0–0.1)
Eosinophils Relative: 0.8 % (ref 0.0–5.0)
HCT: 44.8 % (ref 36.0–46.0)
Lymphs Abs: 2.1 10*3/uL (ref 0.7–4.0)
MCV: 87.6 fl (ref 78.0–100.0)
Monocytes Absolute: 0.7 10*3/uL (ref 0.1–1.0)
Monocytes Relative: 7.7 % (ref 3.0–12.0)
Neutrophils Relative %: 67.3 % (ref 43.0–77.0)
Platelets: 250 10*3/uL (ref 150.0–400.0)
RDW: 13.5 % (ref 11.5–14.6)
WBC: 8.8 10*3/uL (ref 4.5–10.5)

## 2012-11-21 LAB — HEPATIC FUNCTION PANEL
ALT: 14 U/L (ref 0–35)
Alkaline Phosphatase: 79 U/L (ref 39–117)
Bilirubin, Direct: 0.2 mg/dL (ref 0.0–0.3)
Total Bilirubin: 1.4 mg/dL — ABNORMAL HIGH (ref 0.3–1.2)
Total Protein: 7.7 g/dL (ref 6.0–8.3)

## 2012-11-21 LAB — BASIC METABOLIC PANEL
BUN: 25 mg/dL — ABNORMAL HIGH (ref 6–23)
Creatinine, Ser: 0.8 mg/dL (ref 0.4–1.2)
GFR: 76.3 mL/min (ref >60.00)
Glucose, Bld: 79 mg/dL (ref 70–99)
Potassium: 3.8 mEq/L (ref 3.5–5.1)

## 2012-11-21 LAB — TSH: TSH: 0.71 u[IU]/mL (ref 0.35–5.50)

## 2012-11-21 LAB — LIPID PANEL: Cholesterol: 172 mg/dL (ref 0–200)

## 2012-11-21 MED ORDER — TRIAMTERENE-HCTZ 37.5-25 MG PO TABS: ORAL_TABLET | ORAL | Status: DC

## 2012-11-21 NOTE — Assessment & Plan Note (Signed)
The patient has a history of hypercholesterolemia and is on low-dose atorvastatin.  Her diet consists of going out to eat with family to restaurants for lunch each day.  In that way the family make sure she gets at least one good meal although she does not tend to eat very much.  She also is suppose to drink a can of Ensure each day although does not always do it and the family has to constantly coax her to eat more.

## 2012-11-21 NOTE — Patient Instructions (Addendum)
Will obtain labs today and call you with the results (lp/bmet/hfp/tsh/cbc)  Have sent Triamterene smaller dose to your pharmacy so you will not cut in half, take 1 tablet Monday, Wednesday, Friday, and Saturday only  Keep all medications the same  Your physician wants you to follow-up in: 6 months with fasting labs (lp/bmet/hfp) You will receive a reminder letter in the mail two months in advance. If you don't receive a letter, please call our office to schedule the follow-up appointment.

## 2012-11-21 NOTE — Assessment & Plan Note (Signed)
The patient's blood pressure has been remaining stable on reduced dose of medication.  We will switch her to a smaller version of Maxide 37.5/25 which she will take just 4 days a week Monday Wednesday Friday and Saturday.  She has not been experiencing any chest pain or dizziness or palpitations.

## 2012-11-21 NOTE — Assessment & Plan Note (Signed)
The patient still manages to live in her own home which is her desire.  Her dementia is about the same as last visit

## 2012-11-21 NOTE — Progress Notes (Signed)
Quick Note:  Please report to patient. The recent labs are stable. Continue same medication and careful diet. Report to daughter. Hgb is back up to normal. Thyroid level normal. ______

## 2012-11-21 NOTE — Progress Notes (Signed)
Shelley Adams Date of Birth:  September 16, 1922 [facility] [address] [place], [place]  [address] [phone]         Fax   [phone]  History of Present Illness: This pleasant 79 year old widowed Caucasian female is seen for a six-month followup office visit. She has a past history of essential hypertension and hypercholesterolemia. She also has severe dementia. She has had progressive weight loss due to not eating enough food. She had had previous vaginal bleeding and was found to have endometrial cancer and in the summer of 2013 had a successful hysterectomy for endometrial cancer.  She was hospitalized overnight.   Current Outpatient Prescriptions  Medication Sig Dispense Refill  . atorvastatin (LIPITOR) 10 MG tablet Take 10 mg by mouth daily.      Marland Kitchen donepezil (ARICEPT) 5 MG tablet Take 5 mg by mouth daily.      . Multiple Vitamin (MULTIVITAMIN WITH MINERALS) TABS Take 1 tablet by mouth daily with breakfast.      . triamterene-hydrochlorothiazide (MAXZIDE-25) 37.5-25 MG per tablet 1 tablet Monday, Wednesday, Friday, and Saturday only  30 tablet  5   No current facility-administered medications for this visit.    No Known Allergies  Patient Active Problem List   Diagnosis Date Noted  . Endometrial cancer 11/17/2011  . Dementia 12/05/2010  . Hypercholesterolemia 12/05/2010  . Benign hypertensive heart disease without heart failure 12/05/2010    History  Smoking status  . Never Smoker   Smokeless tobacco  . Not on file    History  Alcohol Use No    Family History  Problem Relation Age of Onset  . Coronary artery disease Father   . Transient ischemic attack Father   . Heart attack Brother   . Parkinsonism Sister     Review of Systems: Constitutional: no fever chills diaphoresis or fatigue or change in weight.  Head and neck: no hearing loss, no epistaxis, no photophobia or visual disturbance. Respiratory: No cough, shortness of breath  or wheezing. Cardiovascular: No chest pain peripheral edema, palpitations. Gastrointestinal: No abdominal distention, no abdominal pain, no change in bowel habits hematochezia or melena. Genitourinary: No dysuria, no frequency, no urgency, no nocturia. Musculoskeletal:No arthralgias, no back pain, no gait disturbance or myalgias. Neurological: No dizziness, no headaches, no numbness, no seizures, no syncope, no weakness, no tremors. Hematologic: No lymphadenopathy, no easy bruising. Psychiatric: No confusion, no hallucinations, no sleep disturbance.    Physical Exam: Filed Vitals:   11/21/12 1035  BP: 120/68  Pulse: 75   the general appearance reveals a thin pleasant woman who is demented.The head and neck exam reveals pupils equal and reactive.  Extraocular movements are full.  There is no scleral icterus.  The mouth and pharynx are normal.  The neck is supple.  The carotids reveal no bruits.  The jugular venous pressure is normal.  The  thyroid is not enlarged.  There is no lymphadenopathy.  The chest is clear to percussion and auscultation.  There are no rales or rhonchi.  Expansion of the chest is symmetrical.  The precordium is quiet.  The first heart sound is normal.  The second heart sound is physiologically split.  There is no murmur gallop rub or click.  There is no abnormal lift or heave.  The abdomen is soft and nontender.  The bowel sounds are normal.  The liver and spleen are not enlarged.  There are no abdominal masses.  There are no abdominal bruits.  Extremities reveal  good pedal pulses.  There is no phlebitis or edema.  There is no cyanosis or clubbing.  Strength is normal and symmetrical in all extremities.  There is no lateralizing weakness.  There are no sensory deficits.  The skin is warm and dry.  There is no rash.  EKG shows normal sinus rhythm and no ischemic changes.   Assessment / Plan: Continue same medication.  We're switching to a smaller size of Maxide Which she  will take 4 days a week. Return in 6 months for followup office visit CBC lipid panel hepatic function panel and basal metabolic

## 2012-11-25 ENCOUNTER — Telehealth: Payer: Self-pay | Admitting: *Deleted

## 2012-11-25 NOTE — Telephone Encounter (Signed)
Message copied by Burnell Blanks on Mon Nov 25, 2012 11:02 AM ------      Message from: Cassell Clement      Created: Thu Nov 21, 2012  9:02 PM       Please report to patient.  The recent labs are stable. Continue same medication and careful diet. Report to daughter. Hgb is back up to normal. Thyroid level normal. ------

## 2012-11-25 NOTE — Telephone Encounter (Signed)
Advised daughter of labs 

## 2012-12-04 ENCOUNTER — Other Ambulatory Visit: Payer: Self-pay | Admitting: Cardiology

## 2013-02-10 ENCOUNTER — Telehealth: Payer: Self-pay | Admitting: Cardiology

## 2013-02-10 NOTE — Telephone Encounter (Signed)
Spoke with daughter and she stated that her mother may have fallen last week but wasn't sure. She is now having a lot of lower back pain. Discussed with  Dr. Patty Sermons and he recommended patient going to ED for xrays a workup. Explained to him daughter had expressed she didn't know if her mom would be willing so he suggested going for xray if she refused. Spoke with daughter and she feels that she is probably just bruised but will think about it and call back if she needs something.

## 2013-02-10 NOTE — Telephone Encounter (Signed)
Shelley Adams is calling in today with concerns that the pt may have fallen. Shelley Adams would like to be advised on what to do. Shelley Adams states the pt has severe back pain and struggles getting out of bed.

## 2013-02-12 ENCOUNTER — Encounter (HOSPITAL_COMMUNITY): Payer: Self-pay | Admitting: Emergency Medicine

## 2013-02-12 ENCOUNTER — Emergency Department (HOSPITAL_COMMUNITY)
Admission: EM | Admit: 2013-02-12 | Discharge: 2013-02-12 | Disposition: A | Discharge status: Home / Self Care | Payer: Medicare Other | Attending: Emergency Medicine | Admitting: Emergency Medicine

## 2013-02-12 ENCOUNTER — Emergency Department (HOSPITAL_COMMUNITY): Payer: Medicare Other

## 2013-02-12 DIAGNOSIS — Y929 Unspecified place or not applicable: Secondary | ICD-10-CM | POA: Insufficient documentation

## 2013-02-12 DIAGNOSIS — E785 Hyperlipidemia, unspecified: Secondary | ICD-10-CM | POA: Insufficient documentation

## 2013-02-12 DIAGNOSIS — S32009A Unspecified fracture of unspecified lumbar vertebra, initial encounter for closed fracture: Secondary | ICD-10-CM | POA: Insufficient documentation

## 2013-02-12 DIAGNOSIS — F0390 Unspecified dementia without behavioral disturbance: Secondary | ICD-10-CM | POA: Insufficient documentation

## 2013-02-12 DIAGNOSIS — S32030A Wedge compression fracture of third lumbar vertebra, initial encounter for closed fracture: Secondary | ICD-10-CM

## 2013-02-12 DIAGNOSIS — I10 Essential (primary) hypertension: Secondary | ICD-10-CM | POA: Insufficient documentation

## 2013-02-12 DIAGNOSIS — Z8739 Personal history of other diseases of the musculoskeletal system and connective tissue: Secondary | ICD-10-CM | POA: Insufficient documentation

## 2013-02-12 DIAGNOSIS — Y939 Activity, unspecified: Secondary | ICD-10-CM | POA: Insufficient documentation

## 2013-02-12 DIAGNOSIS — Z8589 Personal history of malignant neoplasm of other organs and systems: Secondary | ICD-10-CM | POA: Insufficient documentation

## 2013-02-12 DIAGNOSIS — R296 Repeated falls: Secondary | ICD-10-CM | POA: Insufficient documentation

## 2013-02-12 DIAGNOSIS — M549 Dorsalgia, unspecified: Secondary | ICD-10-CM

## 2013-02-12 DIAGNOSIS — N390 Urinary tract infection, site not specified: Secondary | ICD-10-CM

## 2013-02-12 DIAGNOSIS — Z8719 Personal history of other diseases of the digestive system: Secondary | ICD-10-CM | POA: Insufficient documentation

## 2013-02-12 DIAGNOSIS — Z79899 Other long term (current) drug therapy: Secondary | ICD-10-CM | POA: Insufficient documentation

## 2013-02-12 LAB — CBC WITH DIFFERENTIAL/PLATELET
Eosinophils Absolute: 0.1 10*3/uL (ref 0.0–0.7)
Eosinophils Relative: 1 % (ref 0–5)
HCT: 42.3 % (ref 36.0–46.0)
Hemoglobin: 13.9 g/dL (ref 12.0–15.0)
Lymphs Abs: 1.2 10*3/uL (ref 0.7–4.0)
MCH: 28.1 pg (ref 26.0–34.0)
MCV: 85.6 fL (ref 78.0–100.0)
Monocytes Absolute: 0.6 10*3/uL (ref 0.1–1.0)
Monocytes Relative: 6 % (ref 3–12)
Neutro Abs: 7.2 10*3/uL (ref 1.7–7.7)
Platelets: 258 10*3/uL (ref 150–400)
RBC: 4.94 MIL/uL (ref 3.87–5.11)
RDW: 13.4 % (ref 11.5–15.5)

## 2013-02-12 LAB — BASIC METABOLIC PANEL
BUN: 24 mg/dL — ABNORMAL HIGH (ref 6–23)
Calcium: 9.7 mg/dL (ref 8.4–10.5)
Creatinine, Ser: 0.66 mg/dL (ref 0.50–1.10)
GFR calc non Af Amer: 77 mL/min — ABNORMAL LOW (ref >90)
Glucose, Bld: 97 mg/dL (ref 70–99)
Sodium: 144 mEq/L (ref 135–145)

## 2013-02-12 LAB — URINALYSIS, ROUTINE W REFLEX MICROSCOPIC
Bilirubin Urine: NEGATIVE
Ketones, ur: NEGATIVE mg/dL
Nitrite: POSITIVE — AB
Urobilinogen, UA: 1 mg/dL (ref 0.0–1.0)

## 2013-02-12 MED ORDER — DEXTROSE 5 % IV SOLN
1.0000 g | Freq: Once | INTRAVENOUS | Status: AC
  Administered 2013-02-12: 1 g via INTRAVENOUS
  Filled 2013-02-12: qty 10

## 2013-02-12 MED ORDER — CEPHALEXIN 500 MG PO CAPS: 500.0000 mg | ORAL_CAPSULE | Freq: Two times a day (BID) | ORAL | Status: DC

## 2013-02-12 MED ORDER — HYDROCODONE-ACETAMINOPHEN 5-325 MG PO TABS: 1.0000 | ORAL_TABLET | Freq: Four times a day (QID) | ORAL | Status: DC | PRN

## 2013-02-12 MED ORDER — HYDROCODONE-ACETAMINOPHEN 5-325 MG PO TABS
1.0000 | ORAL_TABLET | Freq: Once | ORAL | Status: AC
  Administered 2013-02-12: 1 via ORAL
  Filled 2013-02-12: qty 1

## 2013-02-12 NOTE — ED Provider Notes (Signed)
CSN: [id number]     Arrival date & time 02/12/13  7829 History   First MD Initiated Contact with Patient 02/12/13 1010     Chief Complaint  Patient presents with  . Back Pain   (Consider location/radiation/quality/duration/timing/severity/associated sxs/prior Treatment) HPI Comments: 79 yo female with arthritis, dementia hx presents with daughter for back pain for a few weeks.  No witessed falls but pt does fall at times.  No known back surgeries.  Pain worse with movement.  Daughter wanted her evaluated since her pcp is cardiologist and she is interested in getting her an internist.  No fevers or change in mental status.    Patient is a 79 y.o. female presenting with back pain. The history is provided by the patient and a relative.  Back Pain Associated symptoms: no abdominal pain, no chest pain, no dysuria, no fever, no headaches and no weakness     Past Medical History  Diagnosis Date  . Hypertension   . Hyperlipidemia     history of  . Osteopenia   . Diverticulosis     history of  . Dementia   . Cancer     endometrial   Past Surgical History  Procedure Laterality Date  . Cataract extraction, bilateral    . Oophorectomy      left  . Tonsillectomy    . Vaginal hysterectomy  11/21/2011    Procedure: HYSTERECTOMY VAGINAL;  Surgeon: Jeannette Corpus, MD;  Location: [facility];  Service: Gynecology;  Laterality: N/A;   Family History  Problem Relation Age of Onset  . Coronary artery disease Father   . Transient ischemic attack Father   . Heart attack Brother   . Parkinsonism Sister    History  Substance Use Topics  . Smoking status: Never Smoker   . Smokeless tobacco: Not on file  . Alcohol Use: No   OB History   Grav Para Term Preterm Abortions TAB SAB Ect Mult Living                 Review of Systems  Constitutional: Negative for fever and chills.  HENT: Negative for neck pain and neck stiffness.   Eyes: Negative for visual disturbance.  Respiratory:  Negative for shortness of breath.   Cardiovascular: Negative for chest pain.  Gastrointestinal: Negative for vomiting and abdominal pain.  Genitourinary: Negative for dysuria and flank pain.  Musculoskeletal: Positive for back pain. Negative for gait problem.  Skin: Negative for rash.  Neurological: Negative for weakness, light-headedness and headaches.    Allergies  Review of patient's allergies indicates no known allergies.  Home Medications   Current Outpatient Rx  Name  Route  Sig  Dispense  Refill  . atorvastatin (LIPITOR) 10 MG tablet   Oral   Take 10 mg by mouth daily.         Marland Kitchen donepezil (ARICEPT) 5 MG tablet   Oral   Take 5 mg by mouth daily.         . Multiple Vitamin (MULTIVITAMIN WITH MINERALS) TABS   Oral   Take 1 tablet by mouth daily with breakfast.         . triamterene-hydrochlorothiazide (MAXZIDE) 75-50 MG per tablet   Oral   Take 1 tablet by mouth every Monday, Wednesday, Friday, and Saturday at 6 PM.         . cephALEXin (KEFLEX) 500 MG capsule   Oral   Take 1 capsule (500 mg total) by mouth 2 (two) times daily.  10 capsule   0   . HYDROcodone-acetaminophen (NORCO) 5-325 MG per tablet   Oral   Take 1 tablet by mouth every 6 (six) hours as needed for pain.   10 tablet   0    BP 140/68  Pulse 86  Temp(Src) 98.4 F (36.9 C) (Oral)  Resp 20  SpO2 100% Physical Exam  Nursing note and vitals reviewed. Constitutional: She appears well-developed and well-nourished.  HENT:  Head: Normocephalic and atraumatic.  Eyes: Conjunctivae are normal. Right eye exhibits no discharge. Left eye exhibits no discharge.  Neck: Normal range of motion. Neck supple. No tracheal deviation present.  Cardiovascular: Normal rate and regular rhythm.   Pulmonary/Chest: Effort normal and breath sounds normal.  Abdominal: Soft. She exhibits no distension. There is no tenderness. There is no guarding.  Musculoskeletal: She exhibits tenderness. She exhibits no  edema.  Mild tender lower paraspinal and midline lumbar and sacral, no step off  Neurological: She is alert. No cranial nerve deficit.  Reflex Scores:      Patellar reflexes are 1+ on the right side and 1+ on the left side.      Achilles reflexes are 1+ on the right side and 1+ on the left side. 5+ strength with f/e of hips, knees and great toe Sensation intact to palpation in all major nerves.    Skin: Skin is warm. No rash noted.  Psychiatric: She has a normal mood and affect.    ED Course  Procedures (including critical care time) Labs Review Labs Reviewed  URINALYSIS, ROUTINE W REFLEX MICROSCOPIC - Abnormal; Notable for the following:    APPearance TURBID (*)    Nitrite POSITIVE (*)    Leukocytes, UA LARGE (*)    All other components within normal limits  URINE MICROSCOPIC-ADD ON - Abnormal; Notable for the following:    Bacteria, UA MANY (*)    All other components within normal limits  CBC WITH DIFFERENTIAL - Abnormal; Notable for the following:    Neutrophils Relative % 80 (*)    All other components within normal limits  BASIC METABOLIC PANEL - Abnormal; Notable for the following:    BUN 24 (*)    GFR calc non Af Amer 77 (*)    GFR calc Af Amer 89 (*)    All other components within normal limits  URINE CULTURE   Imaging Review Dg Lumbar Spine Complete  02/12/2013   CLINICAL DATA:  History of falls, low back pain  EXAM: LUMBAR SPINE - COMPLETE 4+ VIEW  COMPARISON:  None.  FINDINGS: There is a lumbar scoliosis convex to the left and the bones are diffusely osteopenic. There is a significant compression deformity of L3 vertebral body by approximately 80% with some retropulsion of uncertain age, most likely acute or subacute. No other compression deformity is seen. There is degenerative change involving the facet joints particularly of L4-5 and L5-S1.  IMPRESSION: 1. Approximately 80% compression deformity of L3 with some retropulsion, most likely acute or subacute. 2.  Diffuse osteopenia with lumbar scoliosis convex to the left.   Electronically Signed   By: Dwyane Dee M.D.   On: 02/12/2013 11:30    MDM   1. Back pain   2. Compression fracture of L3 lumbar vertebra, closed, initial encounter   3. UTI (lower urinary tract infection)    New compression fx L3. Pain controlled in ED> Neuro exam unremarkable.  UTI, abx given. Rec MRI for further details of compression fx.  Daughter explained the  goal is pain control and fup outpt as pt and family would never allow any procedures.  Discussed with ortho on call for brace and fup oupt.  Ortho tech consulted for brace by nurse.   DC discussed.   Compression fx, UTI, Back pain     Enid Skeens, MD 02/12/13 [phone]

## 2013-02-12 NOTE — ED Notes (Signed)
Pt c/o lt sided back pain x 1 wk.  Denies dysuria.  Pt has dementia and is an unreliable historian.  Daughter states that her cardiologist sent her here because "he doesn't do backs".

## 2013-02-14 ENCOUNTER — Telehealth: Payer: Self-pay | Admitting: Cardiology

## 2013-02-14 LAB — URINE CULTURE: Colony Count: >=100000

## 2013-02-14 NOTE — Telephone Encounter (Signed)
New Problem:  Pt's daughter states she would like to speak with Juliette Alcide about her mother's plan of care. Pt's daughter states her mom fell and would like some advise.

## 2013-02-14 NOTE — Telephone Encounter (Signed)
Spoke with daughter and will have patient see orthopedic md for compression fracture. Patient seen recently in ED

## 2013-02-15 ENCOUNTER — Telehealth (HOSPITAL_COMMUNITY): Payer: Self-pay | Admitting: Emergency Medicine

## 2013-02-15 NOTE — ED Notes (Signed)
Post ED Visit - Positive Culture Follow-up  Culture report reviewed by antimicrobial stewardship pharmacist: []  Wes Dulaney, Pharm.D., BCPS []  Celedonio Miyamoto, Pharm.D., BCPS [x]  Georgina Pillion, Pharm.D., BCPS []  [place], [address].D., BCPS, AAHIVP []  Estella Husk, Pharm.D., BCPS, AAHIVP  Positive urine culture Treated with Keflex, organism sensitive to the same and no further patient follow-up is required at this time.  Kylie A Holland 02/15/2013, 4:02 PM

## 2013-07-06 ENCOUNTER — Encounter (HOSPITAL_COMMUNITY): Payer: Self-pay | Admitting: Emergency Medicine

## 2013-07-06 ENCOUNTER — Emergency Department (INDEPENDENT_AMBULATORY_CARE_PROVIDER_SITE_OTHER)
Admission: EM | Admit: 2013-07-06 | Discharge: 2013-07-06 | Disposition: A | Discharge status: Home / Self Care | Payer: Medicare Other | Source: Home / Self Care | Attending: Family Medicine | Admitting: Family Medicine

## 2013-07-06 DIAGNOSIS — N390 Urinary tract infection, site not specified: Secondary | ICD-10-CM

## 2013-07-06 LAB — POCT URINALYSIS DIP (DEVICE)
Bilirubin Urine: NEGATIVE
GLUCOSE, UA: NEGATIVE mg/dL
Ketones, ur: NEGATIVE mg/dL
Nitrite: NEGATIVE
Protein, ur: NEGATIVE mg/dL
Urobilinogen, UA: 0.2 mg/dL (ref 0.0–1.0)
pH: 5.5 (ref 5.0–8.0)

## 2013-07-06 MED ORDER — CEPHALEXIN 500 MG PO CAPS: 500.0000 mg | ORAL_CAPSULE | Freq: Four times a day (QID) | ORAL | Status: DC

## 2013-07-06 NOTE — ED Notes (Signed)
Per family: pt had abd pain on Friday morning; Friday evening had few episodes vomiting that resolved.  Family tested urine via OTC test - showed positive for UTI.  Denies any fevers.  Able to keep down fluids well.

## 2013-07-06 NOTE — Discharge Instructions (Signed)
Take all of medicine as directed, drink lots of fluids, see your doctor if further problems. °

## 2013-07-06 NOTE — ED Provider Notes (Signed)
CSN: [id number]     Arrival date & time 07/06/13  1525 History   First MD Initiated Contact with Patient 07/06/13 1557     Chief Complaint  Patient presents with  . Urinary Tract Infection     (Consider location/radiation/quality/duration/timing/severity/associated sxs/prior Treatment) Patient is a 80 y.o. female presenting with frequency. The history is provided by the patient and a relative.  Urinary Frequency This is a new problem. The current episode started 2 days ago. The problem has been gradually worsening.    Past Medical History  Diagnosis Date  . Hypertension   . Hyperlipidemia     history of  . Osteopenia   . Diverticulosis     history of  . Dementia   . Cancer     endometrial   Past Surgical History  Procedure Laterality Date  . Cataract extraction, bilateral    . Oophorectomy      left  . Tonsillectomy    . Vaginal hysterectomy  11/21/2011    Procedure: HYSTERECTOMY VAGINAL;  Surgeon: Alvino Chapel, MD;  Location: [facility];  Service: Gynecology;  Laterality: N/A;   Family History  Problem Relation Age of Onset  . Coronary artery disease Father   . Transient ischemic attack Father   . Heart attack Brother   . Parkinsonism Sister    History  Substance Use Topics  . Smoking status: Never Smoker   . Smokeless tobacco: Not on file  . Alcohol Use: No   OB History   Grav Para Term Preterm Abortions TAB SAB Ect Mult Living                 Review of Systems  Constitutional: Negative.   Gastrointestinal: Negative.   Genitourinary: Positive for dysuria, urgency, frequency and pelvic pain. Negative for vaginal bleeding, vaginal discharge and menstrual problem.      Allergies  Review of patient's allergies indicates no known allergies.  Home Medications   Current Outpatient Rx  Name  Route  Sig  Dispense  Refill  . atorvastatin (LIPITOR) 10 MG tablet   Oral   Take 10 mg by mouth daily.         Marland Kitchen donepezil (ARICEPT) 5 MG tablet    Oral   Take 5 mg by mouth daily.         . Multiple Vitamin (MULTIVITAMIN WITH MINERALS) TABS   Oral   Take 1 tablet by mouth daily with breakfast.         . triamterene-hydrochlorothiazide (MAXZIDE) 75-50 MG per tablet   Oral   Take 1 tablet by mouth every Monday, Wednesday, Friday, and Saturday at 6 PM.         . cephALEXin (KEFLEX) 500 MG capsule   Oral   Take 1 capsule (500 mg total) by mouth 2 (two) times daily.   10 capsule   0   . cephALEXin (KEFLEX) 500 MG capsule   Oral   Take 1 capsule (500 mg total) by mouth 4 (four) times daily. Take all of medicine and drink lots of fluids   20 capsule   0   . HYDROcodone-acetaminophen (NORCO) 5-325 MG per tablet   Oral   Take 1 tablet by mouth every 6 (six) hours as needed for pain.   10 tablet   0    BP 122/68  Pulse 91  Temp(Src) 98.1 F (36.7 C) (Oral)  Resp 18  SpO2 100% Physical Exam  Nursing note and vitals reviewed. Constitutional: She appears well-developed  and well-nourished. No distress.  Abdominal: Soft. She exhibits no distension and no mass. There is no tenderness. There is no rebound and no guarding.  Neurological: She is alert.  Skin: Skin is warm and dry.    ED Course  Procedures (including critical care time) Labs Review Labs Reviewed  POCT URINALYSIS DIP (DEVICE) - Abnormal; Notable for the following:    Hgb urine dipstick SMALL (*)    Leukocytes, UA MODERATE (*)    All other components within normal limits   Imaging Review No results found.    MDM   Final diagnoses:  UTI (lower urinary tract infection)        Billy Fischer, MD 07/06/13 [phone]

## 2013-07-10 ENCOUNTER — Encounter: Payer: Self-pay | Admitting: Cardiology

## 2013-07-10 ENCOUNTER — Ambulatory Visit (INDEPENDENT_AMBULATORY_CARE_PROVIDER_SITE_OTHER): Payer: Medicare Other | Admitting: Cardiology

## 2013-07-10 VITALS — BP 112/65 | HR 88 | Ht 62.0 in | Wt 90.4 lb

## 2013-07-10 DIAGNOSIS — F0390 Unspecified dementia without behavioral disturbance: Secondary | ICD-10-CM

## 2013-07-10 DIAGNOSIS — E7800 Pure hypercholesterolemia, unspecified: Secondary | ICD-10-CM

## 2013-07-10 DIAGNOSIS — I119 Hypertensive heart disease without heart failure: Secondary | ICD-10-CM

## 2013-07-10 NOTE — Assessment & Plan Note (Signed)
Blood pressure was remaining normal on low-dose diuretic.

## 2013-07-10 NOTE — Assessment & Plan Note (Signed)
The patient is on low-dose Lipitor 10 mg daily.  She did not come in fasting today.  We will get her blood work at her next visit therefore.

## 2013-07-10 NOTE — Patient Instructions (Signed)
Your physician recommends that you continue on your current medications as directed. Please refer to the Current Medication list given to you today.  Your physician wants you to follow-up in: 6 months with fasting labs (lp/bmet/hfp)  You will receive a reminder letter in the mail two months in advance. If you don't receive a letter, please call our office to schedule the follow-up appointment.   Have scheduled you an appointment 08/21/13 arrive at 8:30 for 9:15 at [facility], [address]. If you need to reschedule the number is [phone]

## 2013-07-10 NOTE — Progress Notes (Signed)
Shelley Adams Date of Birth:  01/20/1923 [address] [place], [place]  [address] [phone]         Fax   [phone]  History of Present Illness: This pleasant 80 year old widowed Caucasian female is seen for a six-month followup office visit. She has a past history of essential hypertension and hypercholesterolemia. She also has severe dementia. She has had progressive weight loss due to not eating enough food. She had had previous vaginal bleeding and was found to have endometrial cancer and in the summer of 2013 had a successful hysterectomy for endometrial cancer.  She was hospitalized overnight.  The operation was a distinct success and she has had no further vaginal bleeding.   Current Outpatient Prescriptions  Medication Sig Dispense Refill  . atorvastatin (LIPITOR) 10 MG tablet Take 10 mg by mouth daily.      . cephALEXin (KEFLEX) 500 MG capsule Take 1 capsule (500 mg total) by mouth 2 (two) times daily.  10 capsule  0  . cephALEXin (KEFLEX) 500 MG capsule Take 1 capsule (500 mg total) by mouth 4 (four) times daily. Take all of medicine and drink lots of fluids  20 capsule  0  . donepezil (ARICEPT) 5 MG tablet Take 5 mg by mouth daily.      . Multiple Vitamin (MULTIVITAMIN WITH MINERALS) TABS Take 1 tablet by mouth daily with breakfast.      . triamterene-hydrochlorothiazide (MAXZIDE) 75-50 MG per tablet Take 1 tablet by mouth every Monday, Wednesday, Friday, and Saturday at 6 PM.       No current facility-administered medications for this visit.    No Known Allergies  Patient Active Problem List   Diagnosis Date Noted  . Endometrial cancer 11/17/2011  . Dementia 12/05/2010  . Hypercholesterolemia 12/05/2010  . Benign hypertensive heart disease without heart failure 12/05/2010    History  Smoking status  . Never Smoker   Smokeless tobacco  . Not on file    History  Alcohol Use No    Family History  Problem Relation Age of Onset  .  Coronary artery disease Father   . Transient ischemic attack Father   . Heart attack Brother   . Parkinsonism Sister     Review of Systems: Constitutional: no fever chills diaphoresis or fatigue or change in weight.  Head and neck: no hearing loss, no epistaxis, no photophobia or visual disturbance. Respiratory: No cough, shortness of breath or wheezing. Cardiovascular: No chest pain peripheral edema, palpitations. Gastrointestinal: No abdominal distention, no abdominal pain, no change in bowel habits hematochezia or melena. Genitourinary: No dysuria, no frequency, no urgency, no nocturia. Musculoskeletal:No arthralgias, no back pain, no gait disturbance or myalgias. Neurological: No dizziness, no headaches, no numbness, no seizures, no syncope, no weakness, no tremors. Hematologic: No lymphadenopathy, no easy bruising. Psychiatric: No confusion, no hallucinations, no sleep disturbance.    Physical Exam: Filed Vitals:   07/10/13 1111  BP: 112/65  Pulse: 88   the general appearance reveals a thin pleasant woman who is demented.The head and neck exam reveals pupils equal and reactive.  Extraocular movements are full.  There is no scleral icterus.  The mouth and pharynx are normal.  The neck is supple.  The carotids reveal no bruits.  The jugular venous pressure is normal.  The  thyroid is not enlarged.  There is no lymphadenopathy.  The chest is clear to percussion and auscultation.  There are no rales or rhonchi.  Expansion of the  chest is symmetrical.  The precordium is quiet.  The first heart sound is normal.  The second heart sound is physiologically split.  There is no murmur gallop rub or click.  There is no abnormal lift or heave.  The abdomen is soft and nontender.  The bowel sounds are normal.  The liver and spleen are not enlarged.  There are no abdominal masses.  There are no abdominal bruits.  Extremities reveal good pedal pulses.  There is no phlebitis or edema.  There is no  cyanosis or clubbing.  Strength is normal and symmetrical in all extremities.  There is no lateralizing weakness.  There are no sensory deficits.  The skin is warm and dry.  There is no rash.    Assessment / Plan: Continue same medication.  I encourage her to try to eat and to drink more to get her weight back up. She needs a primary care physician.  She does have frequent urinary tract symptoms.  She has been going back and forth to the urgent care is when necessary.  We will refer her to the physicians at [facility] and we obtained an appointment for 08/21/13 at 8:30 AM. Recheck here in 6 months for routine followup office visit CBC lipid panel hepatic function panel and basal metabolic panel fasting.

## 2013-07-10 NOTE — Assessment & Plan Note (Signed)
Her dementia is worse.  She is eating poorly.  Mainly she drinks  Ensure.  The family encourages her to eat but she will not eat.  Her weight is down 3 more pounds since last visit and she now weighs 90 pounds. The patient is very happy to be allowed to remain in her own home.  She stays by herself at night and may have a woman come to help look after her during the day.  The patient has 2 out of town daughters.  One lives in [place] and one lives in [place].

## 2013-08-14 ENCOUNTER — Emergency Department (INDEPENDENT_AMBULATORY_CARE_PROVIDER_SITE_OTHER)
Admission: EM | Admit: 2013-08-14 | Discharge: 2013-08-14 | Disposition: A | Discharge status: Home / Self Care | Payer: Medicare Other | Source: Home / Self Care | Attending: Family Medicine | Admitting: Family Medicine

## 2013-08-14 ENCOUNTER — Encounter (HOSPITAL_COMMUNITY): Payer: Self-pay | Admitting: Emergency Medicine

## 2013-08-14 DIAGNOSIS — F0390 Unspecified dementia without behavioral disturbance: Secondary | ICD-10-CM

## 2013-08-14 DIAGNOSIS — N390 Urinary tract infection, site not specified: Secondary | ICD-10-CM

## 2013-08-14 LAB — POCT URINALYSIS DIP (DEVICE)
BILIRUBIN URINE: NEGATIVE
GLUCOSE, UA: NEGATIVE mg/dL
KETONES UR: NEGATIVE mg/dL
Nitrite: POSITIVE — AB
Protein, ur: 30 mg/dL — AB
Specific Gravity, Urine: 1.02 (ref 1.005–1.030)
Urobilinogen, UA: 2 mg/dL — ABNORMAL HIGH (ref 0.0–1.0)
pH: 7 (ref 5.0–8.0)

## 2013-08-14 MED ORDER — NITROFURANTOIN MONOHYD MACRO 100 MG PO CAPS: 100.0000 mg | ORAL_CAPSULE | Freq: Two times a day (BID) | ORAL | Status: DC

## 2013-08-14 NOTE — ED Notes (Signed)
C/o uti  See physician note

## 2013-08-14 NOTE — Discharge Instructions (Signed)
Thank you for coming in today. Take nitrofurantoin twice daily for one week. Followup with a primary care provider. Urinary Tract Infection Urinary tract infections (UTIs) can develop anywhere along your urinary tract. Your urinary tract is your body's drainage system for removing wastes and extra water. Your urinary tract includes two kidneys, two ureters, a bladder, and a urethra. Your kidneys are a pair of bean-shaped organs. Each kidney is about the size of your fist. They are located below your ribs, one on each side of your spine. CAUSES Infections are caused by microbes, which are microscopic organisms, including fungi, viruses, and bacteria. These organisms are so small that they can only be seen through a microscope. Bacteria are the microbes that most commonly cause UTIs. SYMPTOMS  Symptoms of UTIs may vary by age and gender of the patient and by the location of the infection. Symptoms in young women typically include a frequent and intense urge to urinate and a painful, burning feeling in the bladder or urethra during urination. Older women and men are more likely to be tired, shaky, and weak and have muscle aches and abdominal pain. A fever may mean the infection is in your kidneys. Other symptoms of a kidney infection include pain in your back or sides below the ribs, nausea, and vomiting. DIAGNOSIS To diagnose a UTI, your caregiver will ask you about your symptoms. Your caregiver also will ask to provide a urine sample. The urine sample will be tested for bacteria and white blood cells. White blood cells are made by your body to help fight infection. TREATMENT  Typically, UTIs can be treated with medication. Because most UTIs are caused by a bacterial infection, they usually can be treated with the use of antibiotics. The choice of antibiotic and length of treatment depend on your symptoms and the type of bacteria causing your infection. HOME CARE INSTRUCTIONS  If you were prescribed  antibiotics, take them exactly as your caregiver instructs you. Finish the medication even if you feel better after you have only taken some of the medication.  Drink enough water and fluids to keep your urine clear or pale yellow.  Avoid caffeine, tea, and carbonated beverages. They tend to irritate your bladder.  Empty your bladder often. Avoid holding urine for long periods of time.  Empty your bladder before and after sexual intercourse.  After a bowel movement, women should cleanse from front to back. Use each tissue only once. SEEK MEDICAL CARE IF:   You have back pain.  You develop a fever.  Your symptoms do not begin to resolve within 3 days. SEEK IMMEDIATE MEDICAL CARE IF:   You have severe back pain or lower abdominal pain.  You develop chills.  You have nausea or vomiting.  You have continued burning or discomfort with urination. MAKE SURE YOU:   Understand these instructions.  Will watch your condition.  Will get help right away if you are not doing well or get worse. Document Released: 02/15/2005 Document Revised: 11/07/2011 Document Reviewed: 06/16/2011 [facility] Patient Information 2014 [place].

## 2013-08-14 NOTE — ED Provider Notes (Signed)
Shelley Adams is a 80 y.o. female who presents to Urgent Care today for abdominal pain and discomfort and vomiting. Symptoms of been present for several days. Patient has a history of severe dementia and is currently nearing the end of her life from this condition. She no longer is eating but is drinking liquids and Ensure and maintaining her weight at around 90 pounds. Over the past several days she has become more uncomfortable-appearing and complaining of some abdominal pain and has vomited once or twice. The symptoms are consistent with prior episodes of urinary tract infection. Her daughter tested her urine use an over-the-counter test which was concerning for urinary tract infection. This has happened in the past.   Past Medical History  Diagnosis Date  . Hypertension   . Hyperlipidemia     history of  . Osteopenia   . Diverticulosis     history of  . Dementia   . Cancer     endometrial   History  Substance Use Topics  . Smoking status: Never Smoker   . Smokeless tobacco: Not on file  . Alcohol Use: No   ROS as above Medications: No current facility-administered medications for this encounter.   Current Outpatient Prescriptions  Medication Sig Dispense Refill  . atorvastatin (LIPITOR) 10 MG tablet Take 10 mg by mouth daily.      Marland Kitchen donepezil (ARICEPT) 5 MG tablet Take 5 mg by mouth daily.      . Multiple Vitamin (MULTIVITAMIN WITH MINERALS) TABS Take 1 tablet by mouth daily with breakfast.      . nitrofurantoin, macrocrystal-monohydrate, (MACROBID) 100 MG capsule Take 1 capsule (100 mg total) by mouth 2 (two) times daily.  14 capsule  0  . triamterene-hydrochlorothiazide (MAXZIDE) 75-50 MG per tablet Take 1 tablet by mouth every Monday, Wednesday, Friday, and Saturday at 6 PM.        Exam:  BP 112/56  Pulse 92  Temp(Src) 97.8 F (36.6 C) (Oral)  Resp 24  SpO2 97% Gen: Well NAD comfortable appearing HEENT: EOMI,  MMM Lungs: Normal work of breathing. CTABL Heart:  RRR no MRG Abd: NABS, Soft. NT, ND Exts: Brisk capillary refill, warm and well perfused.   Results for orders placed during the hospital encounter of 08/14/13 (from the past 24 hour(s))  POCT URINALYSIS DIP (DEVICE)     Status: Abnormal   Collection Time    08/14/13  1:48 PM      Result Value Ref Range   Glucose, UA NEGATIVE  NEGATIVE mg/dL   Bilirubin Urine NEGATIVE  NEGATIVE   Ketones, ur NEGATIVE  NEGATIVE mg/dL   Specific Gravity, Urine 1.020  1.005 - 1.030   Hgb urine dipstick TRACE (*) NEGATIVE   pH 7.0  5.0 - 8.0   Protein, ur 30 (*) NEGATIVE mg/dL   Urobilinogen, UA 2.0 (*) 0.0 - 1.0 mg/dL   Nitrite POSITIVE (*) NEGATIVE   Leukocytes, UA LARGE (*) NEGATIVE   No results found.  Assessment and Plan: 80 y.o. female with possible UTI versus asymptomatic bacteria. Culture pending. Plan to treat with Macrobid .  Followup with primary care provider. Discussed end-of-life issues. The patient's daughter is doing fantastic job of caring for her and is very organized. There is an advanced directive and living will. Hospice services are not yet involved. Shelley Adams certainly seems comfortable and not to be suffering.  Discussed warning signs or symptoms. Please see discharge instructions. Patient expresses understanding.    Gregor Hams, MD 08/14/13  1424 

## 2013-08-16 LAB — URINE CULTURE: SPECIAL REQUESTS: NORMAL

## 2013-08-16 NOTE — ED Notes (Signed)
Urine culture: >100,000 colonies E. Coli.  Pt. adequately treated with Macrobid. Shelley Adams 08/16/2013

## 2013-08-18 ENCOUNTER — Encounter: Payer: Self-pay | Admitting: *Deleted

## 2013-08-21 ENCOUNTER — Encounter: Payer: Self-pay | Admitting: Nurse Practitioner

## 2013-08-21 ENCOUNTER — Ambulatory Visit (INDEPENDENT_AMBULATORY_CARE_PROVIDER_SITE_OTHER): Payer: Medicare Other | Admitting: Nurse Practitioner

## 2013-08-21 VITALS — BP 96/58 | HR 86 | Temp 97.7°F | Resp 20 | Ht 63.0 in | Wt 91.0 lb

## 2013-08-21 DIAGNOSIS — E7800 Pure hypercholesterolemia, unspecified: Secondary | ICD-10-CM

## 2013-08-21 DIAGNOSIS — M899 Disorder of bone, unspecified: Secondary | ICD-10-CM

## 2013-08-21 DIAGNOSIS — I119 Hypertensive heart disease without heart failure: Secondary | ICD-10-CM

## 2013-08-21 DIAGNOSIS — M8580 Other specified disorders of bone density and structure, unspecified site: Secondary | ICD-10-CM

## 2013-08-21 DIAGNOSIS — F0390 Unspecified dementia without behavioral disturbance: Secondary | ICD-10-CM

## 2013-08-21 DIAGNOSIS — Z8542 Personal history of malignant neoplasm of other parts of uterus: Secondary | ICD-10-CM

## 2013-08-21 DIAGNOSIS — R634 Abnormal weight loss: Secondary | ICD-10-CM

## 2013-08-21 DIAGNOSIS — M949 Disorder of cartilage, unspecified: Secondary | ICD-10-CM

## 2013-08-21 MED ORDER — DONEPEZIL HCL 10 MG PO TABS: 10.0000 mg | ORAL_TABLET | Freq: Every day | ORAL | Status: DC

## 2013-08-21 MED ORDER — MIRTAZAPINE 15 MG PO TABS: ORAL_TABLET | ORAL | Status: DC

## 2013-08-21 NOTE — Patient Instructions (Signed)
STOP Lipitor and Maxide -check blood pressure once weekly notify if greater than 160/100  -CHANGE aricept to 10 mg daily (new Rx sent to pharmacy)  -START remeron by mouth at night for appetite  -may start at 1/2 tablet and increase to 1 if needed   FOLLOW Up in 4-6 weeks for Extended Visit

## 2013-08-21 NOTE — Progress Notes (Signed)
Patient ID: Shelley Adams, female   DOB: 1922/12/05, 81 y.o.   MRN: [id number]    CODE STATUS: DNR  No Known Allergies  Chief Complaint  Patient presents with  . Establish Care    NP Establish Care  . Immunizations    declines Shingles and Pneumo  . other    recurrent UTI's, not eating/drinking, fatigue, hallucinations only with UTI's, stays in bed all the time    HPI: Patient is a 80 y.o. female seen in the office today to establish care;  Pt was previously followed by Dr Mare Ferrari when he was Primary care many years ago then when he transferred to cardiology only she stayed with him however now with dementia, frequent UTIs daughter wishes to transfer service to a Primary Care office.  Currently pt lives alone with help that comes in; daughters come in the morning to get her ready for the day and then helpers come in the evening  Went to neurology 5 years ago and at that time was dx with dementia- no further evaluation or follow up; eating very poorly No immediate concerns today Review of Systems:  Review of Systems  Constitutional: Positive for weight loss and malaise/fatigue. Negative for fever and chills.  HENT: Positive for hearing loss. Negative for ear pain, nosebleeds and tinnitus.   Eyes: Negative for blurred vision.       Glaucoma uses drops   Respiratory: Negative for cough and shortness of breath.   Cardiovascular: Negative for chest pain, palpitations and leg swelling.  Gastrointestinal: Negative for heartburn, nausea, vomiting, abdominal pain, diarrhea and constipation.  Genitourinary: Negative for dysuria, urgency and frequency.  Musculoskeletal: Positive for falls (1 fall in the last year). Negative for back pain, joint pain and myalgias.  Skin: Negative for itching and rash.  Neurological: Negative for dizziness, tingling and headaches.  Psychiatric/Behavioral: Positive for memory loss. Negative for depression and hallucinations. The patient is not  nervous/anxious and does not have insomnia.      Past Medical History  Diagnosis Date  . Hypertension   . Hyperlipidemia     history of  . Osteopenia   . Diverticulosis     history of  . Dementia   . Glaucoma   . Cancer     endometrial   Past Surgical History  Procedure Laterality Date  . Cataract extraction, bilateral    . Oophorectomy      left  . Tonsillectomy    . Vaginal hysterectomy  11/21/2011    Procedure: HYSTERECTOMY VAGINAL;  Surgeon: Alvino Chapel, MD;  Location: [facility];  Service: Gynecology;  Laterality: N/A;  . Abdominal hysterectomy  2012    Dr Meyer Cory   Social History:   reports that she has never smoked. She does not have any smokeless tobacco history on file. She reports that she does not drink alcohol or use illicit drugs.  Family History  Problem Relation Age of Onset  . Coronary artery disease Father   . Transient ischemic attack Father   . Heart attack Brother   . Diabetes Daughter   . Osteoporosis Daughter   . Cancer Daughter     breast cancer  . Parkinsonism Sister     Medications: Patient's Medications  New Prescriptions   No medications on file  Previous Medications   ATORVASTATIN (LIPITOR) 10 MG TABLET    Take 10 mg by mouth daily.   DONEPEZIL (ARICEPT) 5 MG TABLET    Take 5 mg by mouth daily.  MULTIPLE VITAMIN (MULTIVITAMIN WITH MINERALS) TABS    Take 1 tablet by mouth daily with breakfast.   TRIAMTERENE-HYDROCHLOROTHIAZIDE (MAXZIDE) 75-50 MG PER TABLET    Take 1 tablet by mouth every Monday, Wednesday, Friday, and Saturday at 6 PM.  Modified Medications   No medications on file  Discontinued Medications   NITROFURANTOIN, MACROCRYSTAL-MONOHYDRATE, (MACROBID) 100 MG CAPSULE    Take 1 capsule (100 mg total) by mouth 2 (two) times daily.     Physical Exam:  Filed Vitals:   08/21/13 0918  BP: 96/58  Pulse: 86  Temp: 97.7 F (36.5 C)  TempSrc: Oral  Resp: 20  Height: 5\' 3"  (1.6 m)  Weight: 91 lb (41.277 kg)    SpO2: 97%    Physical Exam  Constitutional:  Thin frail appearing female in NAD  HENT:  Head: Normocephalic and atraumatic.  Mouth/Throat: Oropharynx is clear and moist. No oropharyngeal exudate.  Eyes: Conjunctivae and EOM are normal. Pupils are equal, round, and reactive to light.  Neck: Normal range of motion. Neck supple. No thyromegaly present.  Cardiovascular: Normal rate, regular rhythm and normal heart sounds.   Pulmonary/Chest: Effort normal and breath sounds normal.  Abdominal: Soft. Bowel sounds are normal. She exhibits no distension.  Musculoskeletal: Normal range of motion. She exhibits no edema.  Lymphadenopathy:    She has no cervical adenopathy.  Neurological: She is alert.  Uses a walker   Skin: Skin is warm and dry.  Psychiatric: Affect normal.    Labs reviewed: Basic Metabolic Panel:  Recent Labs  11/21/12 1112 02/12/13 1225  NA 143 144  K 3.8 3.6  CL 105 104  CO2 29 28  GLUCOSE 79 97  BUN 25* 24*  CREATININE 0.8 0.66  CALCIUM 9.8 9.7  TSH 0.71  --    Liver Function Tests:  Recent Labs  11/21/12 1112  AST 18  ALT 14  ALKPHOS 79  BILITOT 1.4*  PROT 7.7  ALBUMIN 4.4   No results found for this basename: LIPASE, AMYLASE,  in the last 8760 hours No results found for this basename: AMMONIA,  in the last 8760 hours CBC:  Recent Labs  11/21/12 1112 02/12/13 1225  WBC 8.8 9.0  NEUTROABS 5.9 7.2  HGB 15.2* 13.9  HCT 44.8 42.3  MCV 87.6 85.6  PLT 250.0 258   Lipid Panel:  Recent Labs  11/21/12 1112  CHOL 172  HDL 52.20  LDLCALC 93  TRIG 135.0  CHOLHDL 3   TSH:  Recent Labs  11/21/12 1112  TSH 0.71   A1C: No results found for this basename: HGBA1C     Assessment/Plan 1. Benign hypertensive heart disease without heart failure -blood pressure low today and reports from daughter that this has been low, due to risk of falls and dehydration from poor PO intake will STOP maxide at this time.  -to take blood pressure and  notify if blood pressure is >160/100 off medications - Comprehensive metabolic panel - CBC With differential/Platelet  2. Hypercholesterolemia -after discussion with daughter and due to age and dementia will stop Lipitor at this time, will also not repeat lipid panel  3. Dementia -advanced, all history provided by daughter, pt conts to live alone with assistance, needs help dressing but is able to go to the restroom and feed herself at this time. No incontinence, wander or unsafe behaviors, no recent falls.  -will increase aricept at this time to 10 mg; at next visit will add Namenda titration - DNR (Do Not Resuscitate) -  donepezil (ARICEPT) 10 MG tablet; Take 1 tablet (10 mg total) by mouth daily.  Dispense: 30 tablet; Refill: 3  4. Endometrial cancer, hx of  -had hysterectomy, no other follow up or testing due to pts dementia family and pt do not wish to have further treatment if cancer reoccurs or has lymph node involvement   5. Loss of weight -with decrease appetite and decrease in weight, appears very thin and frail, daughter providing supplements and lots of encouragement to eat. Will add appetite stimulant at this time.  - mirtazapine (REMERON) 15 MG tablet; 1/2-1 tablet every night at bedtime for appetite  Dispense: 30 tablet; Refill: 3 - TSH  6. Osteopenia -previously on medication for this to prevent osteoporosis but due to side effects she stopped medication and decided not to take in the future   To follow up in 4-6 weeks for EV with [facility]

## 2013-08-22 LAB — CBC WITH DIFFERENTIAL
Basophils Absolute: 0 10*3/uL (ref 0.0–0.2)
Basos: 0 %
Eos: 3 %
Eosinophils Absolute: 0.4 10*3/uL (ref 0.0–0.4)
HCT: 41.7 % (ref 34.0–46.6)
Hemoglobin: 14.3 g/dL (ref 11.1–15.9)
IMMATURE GRANULOCYTES: 0 %
Immature Grans (Abs): 0 10*3/uL (ref 0.0–0.1)
Lymphocytes Absolute: 1.1 10*3/uL (ref 0.7–3.1)
Lymphs: 8 %
MCH: 29 pg (ref 26.6–33.0)
MCHC: 34.3 g/dL (ref 31.5–35.7)
MCV: 85 fL (ref 79–97)
MONOS ABS: 1 10*3/uL — AB (ref 0.1–0.9)
Monocytes: 8 %
NEUTROS PCT: 81 %
Neutrophils Absolute: 10.5 10*3/uL — ABNORMAL HIGH (ref 1.4–7.0)
PLATELETS: 268 10*3/uL (ref 150–379)
RBC: 4.93 x10E6/uL (ref 3.77–5.28)
RDW: 14.7 % (ref 12.3–15.4)
WBC: 13 10*3/uL — ABNORMAL HIGH (ref 3.4–10.8)

## 2013-08-22 LAB — TSH: TSH: 0.784 u[IU]/mL (ref 0.450–4.500)

## 2013-08-22 LAB — COMPREHENSIVE METABOLIC PANEL
ALK PHOS: 133 IU/L — AB (ref 39–117)
ALT: 85 IU/L — ABNORMAL HIGH (ref 0–32)
AST: 19 IU/L (ref 0–40)
Albumin/Globulin Ratio: 1.8 (ref 1.1–2.5)
Albumin: 4.1 g/dL (ref 3.5–4.7)
BUN / CREAT RATIO: 40 — AB (ref 11–26)
BUN: 26 mg/dL (ref 8–27)
CHLORIDE: 98 mmol/L (ref 97–108)
CO2: 26 mmol/L (ref 18–29)
Calcium: 9.7 mg/dL (ref 8.7–10.3)
Creatinine, Ser: 0.65 mg/dL (ref 0.57–1.00)
GFR calc Af Amer: 92 mL/min/{1.73_m2} (ref >59)
GFR calc non Af Amer: 80 mL/min/{1.73_m2} (ref >59)
Globulin, Total: 2.3 g/dL (ref 1.5–4.5)
Glucose: 141 mg/dL — ABNORMAL HIGH (ref 65–99)
POTASSIUM: 4.2 mmol/L (ref 3.5–5.2)
SODIUM: 143 mmol/L (ref 134–144)
Total Bilirubin: 1.2 mg/dL (ref 0.0–1.2)
Total Protein: 6.4 g/dL (ref 6.0–8.5)

## 2013-08-29 ENCOUNTER — Other Ambulatory Visit: Payer: Self-pay | Admitting: Nurse Practitioner

## 2013-08-29 DIAGNOSIS — N390 Urinary tract infection, site not specified: Secondary | ICD-10-CM

## 2013-08-29 DIAGNOSIS — D729 Disorder of white blood cells, unspecified: Secondary | ICD-10-CM

## 2013-09-01 ENCOUNTER — Other Ambulatory Visit: Payer: Medicare Other

## 2013-09-01 DIAGNOSIS — N390 Urinary tract infection, site not specified: Secondary | ICD-10-CM

## 2013-09-01 DIAGNOSIS — D729 Disorder of white blood cells, unspecified: Secondary | ICD-10-CM

## 2013-09-02 LAB — URINALYSIS
Bilirubin, UA: NEGATIVE
Glucose, UA: NEGATIVE
Ketones, UA: NEGATIVE
NITRITE UA: POSITIVE — AB
SPEC GRAV UA: 1.02 (ref 1.005–1.030)
Urobilinogen, Ur: 0.2 mg/dL (ref 0.0–1.9)
pH, UA: 6.5 (ref 5.0–7.5)

## 2013-09-02 LAB — CBC WITH DIFFERENTIAL/PLATELET
Basophils Absolute: 0 10*3/uL (ref 0.0–0.2)
Basos: 0 %
EOS ABS: 0.2 10*3/uL (ref 0.0–0.4)
Eos: 2 %
HCT: 45.1 % (ref 34.0–46.6)
Hemoglobin: 15.3 g/dL (ref 11.1–15.9)
IMMATURE GRANS (ABS): 0 10*3/uL (ref 0.0–0.1)
Immature Granulocytes: 0 %
Lymphocytes Absolute: 3.7 10*3/uL — ABNORMAL HIGH (ref 0.7–3.1)
Lymphs: 36 %
MCH: 28.4 pg (ref 26.6–33.0)
MCHC: 33.9 g/dL (ref 31.5–35.7)
MCV: 84 fL (ref 79–97)
Monocytes Absolute: 0.9 10*3/uL (ref 0.1–0.9)
Monocytes: 8 %
Neutrophils Absolute: 5.5 10*3/uL (ref 1.4–7.0)
Neutrophils Relative %: 54 %
RBC: 5.39 x10E6/uL — ABNORMAL HIGH (ref 3.77–5.28)
RDW: 14.9 % (ref 12.3–15.4)
WBC: 10.3 10*3/uL (ref 3.4–10.8)

## 2013-09-03 ENCOUNTER — Other Ambulatory Visit: Payer: Self-pay | Admitting: Nurse Practitioner

## 2013-09-03 DIAGNOSIS — N390 Urinary tract infection, site not specified: Secondary | ICD-10-CM

## 2013-09-03 LAB — URINE CULTURE

## 2013-09-03 MED ORDER — CIPROFLOXACIN HCL 500 MG PO TABS: 500.0000 mg | ORAL_TABLET | Freq: Two times a day (BID) | ORAL | Status: DC

## 2013-09-16 ENCOUNTER — Ambulatory Visit (INDEPENDENT_AMBULATORY_CARE_PROVIDER_SITE_OTHER): Payer: Medicare Other | Admitting: Internal Medicine

## 2013-09-16 ENCOUNTER — Encounter: Payer: Self-pay | Admitting: Internal Medicine

## 2013-09-16 VITALS — BP 102/60 | HR 81 | Temp 97.4°F | Wt 89.0 lb

## 2013-09-16 DIAGNOSIS — N390 Urinary tract infection, site not specified: Secondary | ICD-10-CM | POA: Insufficient documentation

## 2013-09-16 DIAGNOSIS — R4182 Altered mental status, unspecified: Secondary | ICD-10-CM | POA: Insufficient documentation

## 2013-09-16 LAB — POCT URINALYSIS DIPSTICK
Bilirubin, UA: NEGATIVE
Glucose, UA: NEGATIVE
Ketones, UA: NEGATIVE
Nitrite, UA: NEGATIVE
Protein, UA: NEGATIVE
Spec Grav, UA: 1.015
Urobilinogen, UA: 0.2
pH, UA: 6

## 2013-09-16 MED ORDER — SULFAMETHOXAZOLE-TMP DS 800-160 MG PO TABS: 1.0000 | ORAL_TABLET | Freq: Two times a day (BID) | ORAL | Status: DC

## 2013-09-16 MED ORDER — SACCHAROMYCES BOULARDII 250 MG PO CAPS: 250.0000 mg | ORAL_CAPSULE | Freq: Two times a day (BID) | ORAL | Status: DC

## 2013-09-16 NOTE — Progress Notes (Signed)
Patient ID: Shelley Adams, female   DOB: 09/06/1922, 80 y.o.   MRN: [id number]    Chief Complaint  Patient presents with  . Acute Visit    possible uti, low back pain, confusion   No Known Allergies  HPI 80 y/o female patient is here for acute visit. She has been noticed to be confused by her family from 09/12/13. She also has had strong odor in her urine and some hallucinations with low back pain. Denies dysuria or hematuria. No nausea or vomiting. She has hx of dementia. No abdominal pain. Appetite is fair. No fever or chills  ROS No headache or blurry vision No psychosis noted No abdominal pain, nausea or vomiting No diarrhea No focal weakness  Past Medical History  Diagnosis Date  . Hypertension   . Hyperlipidemia     history of  . Osteopenia   . Diverticulosis     history of  . Dementia   . Glaucoma   . Cancer     endometrial   Medication reviewed. See [facility]  Physical exam BP 102/60  Pulse 81  Temp(Src) 97.4 F (36.3 C) (Oral)  Wt 89 lb (40.37 kg)  SpO2 92%  gen- elderly frail female patient in no acute distress HHENT- normal moist mucus membrane, no LAD cvs- n s1,s2, rrr respi-CTAB Abdomen- bs+, no suprapubic tenderness, has mild right cva tenderness on exam Neuro- alert and oriented to person Musculoskeletal- able to move all 4, weakness  Labs- Recent urine culture has pansensitive e.coli  Assessment/plan  1. UTI (urinary tract infection) With recent complaint and recent uti hx, likely has another uti. Encourage hydration, can take cranberry juice. Send u/a with c/s. Will start her empirically on bactrim ds 1 tab bid x 10 days. If the culture grows e.coli again, there is concern for colonization. Also if pt has recurrent uti, will think of low dose prophylactic antibiotic treatment - POCT urinalysis dipstick  2. Altered mental state Likely in setting of her uti. To encourage hydration and will start antibiotic prophylactically. F/u culture report.  Monitor clinically for fever and worsening of her mental status

## 2013-09-16 NOTE — Addendum Note (Signed)
Addended by: Logan Bores on: 09/16/2013 04:24 PM   Modules accepted: Orders

## 2013-09-17 LAB — URINE CULTURE

## 2013-09-22 ENCOUNTER — Telehealth: Payer: Self-pay | Admitting: *Deleted

## 2013-09-22 MED ORDER — TRAMADOL HCL 50 MG PO TABS: 50.0000 mg | ORAL_TABLET | Freq: Three times a day (TID) | ORAL | Status: DC | PRN

## 2013-09-22 NOTE — Telephone Encounter (Signed)
Spoke to pt's daughter, Butch Penny regarding urine culture results; she states that her mother is having severe back pain that is causing her not to be able to sleep or move around.  Is there something like a patch or some kind of medication tha she could get to help relieve the pain? Please advise. Thanks

## 2013-09-22 NOTE — Telephone Encounter (Signed)
rx printed and will be sent to [facility] .

## 2013-09-22 NOTE — Telephone Encounter (Signed)
What is she taking for back pain? Will need to evaluate her for back pain to make sure there is no fracture or muscle spasm. Send her script for tramadol 50 mg 1 tab every 8 hour as needed- 15 pills only and make appointment to see her in clinic with Janett Billow or me. thanks

## 2013-09-27 ENCOUNTER — Emergency Department (HOSPITAL_COMMUNITY): Payer: Medicare Other

## 2013-09-27 ENCOUNTER — Encounter (HOSPITAL_COMMUNITY): Payer: Self-pay | Admitting: Emergency Medicine

## 2013-09-27 ENCOUNTER — Inpatient Hospital Stay (HOSPITAL_COMMUNITY)
Admission: EM | Admit: 2013-09-27 | Discharge: 2013-10-01 | DRG: 469 | Disposition: A | Discharge status: Skilled Nursing Facility | Payer: Medicare Other | Attending: Internal Medicine | Admitting: Internal Medicine

## 2013-09-27 DIAGNOSIS — W19XXXA Unspecified fall, initial encounter: Secondary | ICD-10-CM | POA: Diagnosis present

## 2013-09-27 DIAGNOSIS — F0390 Unspecified dementia without behavioral disturbance: Secondary | ICD-10-CM | POA: Diagnosis present

## 2013-09-27 DIAGNOSIS — Z8719 Personal history of other diseases of the digestive system: Secondary | ICD-10-CM

## 2013-09-27 DIAGNOSIS — M899 Disorder of bone, unspecified: Secondary | ICD-10-CM | POA: Diagnosis present

## 2013-09-27 DIAGNOSIS — Z681 Body mass index (BMI) 19 or less, adult: Secondary | ICD-10-CM

## 2013-09-27 DIAGNOSIS — R64 Cachexia: Secondary | ICD-10-CM | POA: Diagnosis present

## 2013-09-27 DIAGNOSIS — S72033A Displaced midcervical fracture of unspecified femur, initial encounter for closed fracture: Principal | ICD-10-CM | POA: Diagnosis present

## 2013-09-27 DIAGNOSIS — F03C0 Unspecified dementia, severe, without behavioral disturbance, psychotic disturbance, mood disturbance, and anxiety: Secondary | ICD-10-CM | POA: Diagnosis present

## 2013-09-27 DIAGNOSIS — Z79899 Other long term (current) drug therapy: Secondary | ICD-10-CM

## 2013-09-27 DIAGNOSIS — Z66 Do not resuscitate: Secondary | ICD-10-CM | POA: Diagnosis present

## 2013-09-27 DIAGNOSIS — Z833 Family history of diabetes mellitus: Secondary | ICD-10-CM

## 2013-09-27 DIAGNOSIS — S72009A Fracture of unspecified part of neck of unspecified femur, initial encounter for closed fracture: Secondary | ICD-10-CM | POA: Diagnosis present

## 2013-09-27 DIAGNOSIS — Z8249 Family history of ischemic heart disease and other diseases of the circulatory system: Secondary | ICD-10-CM

## 2013-09-27 DIAGNOSIS — Z803 Family history of malignant neoplasm of breast: Secondary | ICD-10-CM

## 2013-09-27 DIAGNOSIS — S72001A Fracture of unspecified part of neck of right femur, initial encounter for closed fracture: Secondary | ICD-10-CM | POA: Diagnosis present

## 2013-09-27 DIAGNOSIS — I10 Essential (primary) hypertension: Secondary | ICD-10-CM | POA: Diagnosis present

## 2013-09-27 DIAGNOSIS — M949 Disorder of cartilage, unspecified: Secondary | ICD-10-CM

## 2013-09-27 DIAGNOSIS — E46 Unspecified protein-calorie malnutrition: Secondary | ICD-10-CM | POA: Insufficient documentation

## 2013-09-27 DIAGNOSIS — Z87440 Personal history of urinary (tract) infections: Secondary | ICD-10-CM

## 2013-09-27 DIAGNOSIS — H409 Unspecified glaucoma: Secondary | ICD-10-CM | POA: Diagnosis present

## 2013-09-27 DIAGNOSIS — Z823 Family history of stroke: Secondary | ICD-10-CM

## 2013-09-27 DIAGNOSIS — E785 Hyperlipidemia, unspecified: Secondary | ICD-10-CM | POA: Diagnosis present

## 2013-09-27 DIAGNOSIS — Z8262 Family history of osteoporosis: Secondary | ICD-10-CM

## 2013-09-27 DIAGNOSIS — E43 Unspecified severe protein-calorie malnutrition: Secondary | ICD-10-CM | POA: Diagnosis present

## 2013-09-27 DIAGNOSIS — D62 Acute posthemorrhagic anemia: Secondary | ICD-10-CM | POA: Diagnosis not present

## 2013-09-27 LAB — BASIC METABOLIC PANEL
BUN: 33 mg/dL — AB (ref 6–23)
CHLORIDE: 101 meq/L (ref 96–112)
CO2: 28 meq/L (ref 19–32)
Calcium: 9.2 mg/dL (ref 8.4–10.5)
Creatinine, Ser: 0.95 mg/dL (ref 0.50–1.10)
GFR calc Af Amer: 60 mL/min — ABNORMAL LOW (ref >90)
GFR calc non Af Amer: 52 mL/min — ABNORMAL LOW (ref >90)
Glucose, Bld: 102 mg/dL — ABNORMAL HIGH (ref 70–99)
Potassium: 4.1 mEq/L (ref 3.7–5.3)
Sodium: 140 mEq/L (ref 137–147)

## 2013-09-27 LAB — URINALYSIS, ROUTINE W REFLEX MICROSCOPIC
BILIRUBIN URINE: NEGATIVE
GLUCOSE, UA: NEGATIVE mg/dL
HGB URINE DIPSTICK: NEGATIVE
KETONES UR: 15 mg/dL — AB
LEUKOCYTES UA: NEGATIVE
Nitrite: NEGATIVE
PH: 7.5 (ref 5.0–8.0)
PROTEIN: NEGATIVE mg/dL
Specific Gravity, Urine: 1.016 (ref 1.005–1.030)
Urobilinogen, UA: 0.2 mg/dL (ref 0.0–1.0)

## 2013-09-27 LAB — CBC WITH DIFFERENTIAL/PLATELET
BASOS PCT: 0 % (ref 0–1)
Basophils Absolute: 0 10*3/uL (ref 0.0–0.1)
Eosinophils Absolute: 0.1 10*3/uL (ref 0.0–0.7)
Eosinophils Relative: 1 % (ref 0–5)
HEMATOCRIT: 40.4 % (ref 36.0–46.0)
Hemoglobin: 13.5 g/dL (ref 12.0–15.0)
Lymphocytes Relative: 10 % — ABNORMAL LOW (ref 12–46)
Lymphs Abs: 1 10*3/uL (ref 0.7–4.0)
MCH: 28.5 pg (ref 26.0–34.0)
MCHC: 33.4 g/dL (ref 30.0–36.0)
MCV: 85.2 fL (ref 78.0–100.0)
MONO ABS: 0.7 10*3/uL (ref 0.1–1.0)
Monocytes Relative: 7 % (ref 3–12)
NEUTROS ABS: 8.2 10*3/uL — AB (ref 1.7–7.7)
NEUTROS PCT: 83 % — AB (ref 43–77)
Platelets: 227 10*3/uL (ref 150–400)
RBC: 4.74 MIL/uL (ref 3.87–5.11)
RDW: 14 % (ref 11.5–15.5)
WBC: 9.9 10*3/uL (ref 4.0–10.5)

## 2013-09-27 LAB — SURGICAL PCR SCREEN
MRSA, PCR: NEGATIVE
Staphylococcus aureus: NEGATIVE

## 2013-09-27 MED ORDER — CRANBERRY-VITAMIN C-VITAMIN E 4200-20-3 MG-MG-UNIT PO CAPS: 1.0000 | ORAL_CAPSULE | Freq: Every morning | ORAL | Status: DC

## 2013-09-27 MED ORDER — ACETAMINOPHEN 325 MG PO TABS: 650.0000 mg | ORAL_TABLET | Freq: Four times a day (QID) | ORAL | Status: DC | PRN

## 2013-09-27 MED ORDER — SODIUM CHLORIDE 0.9 % IV BOLUS (SEPSIS)
500.0000 mL | Freq: Once | INTRAVENOUS | Status: AC
  Administered 2013-09-27: 500 mL via INTRAVENOUS

## 2013-09-27 MED ORDER — ACETAMINOPHEN 650 MG RE SUPP: 650.0000 mg | Freq: Four times a day (QID) | RECTAL | Status: DC | PRN

## 2013-09-27 MED ORDER — TRAMADOL HCL 50 MG PO TABS
50.0000 mg | ORAL_TABLET | Freq: Three times a day (TID) | ORAL | Status: DC | PRN
Start: 2013-09-27 — End: 2013-10-01
  Filled 2013-09-27: qty 1

## 2013-09-27 MED ORDER — FENTANYL CITRATE 0.05 MG/ML IJ SOLN
50.0000 ug | Freq: Once | INTRAMUSCULAR | Status: AC
  Administered 2013-09-27: 50 ug via INTRAVENOUS
  Filled 2013-09-27: qty 2

## 2013-09-27 MED ORDER — SODIUM CHLORIDE 0.9 % IV SOLN: INTRAVENOUS | Status: DC

## 2013-09-27 MED ORDER — FENTANYL CITRATE 0.05 MG/ML IJ SOLN: 50.0000 ug | INTRAMUSCULAR | Status: DC | PRN

## 2013-09-27 MED ORDER — SODIUM CHLORIDE 0.9 % IV SOLN
INTRAVENOUS | Status: DC
  Administered 2013-09-27 – 2013-09-28 (×2): 125 mL/h via INTRAVENOUS
  Administered 2013-09-28: 07:00:00 via INTRAVENOUS

## 2013-09-27 MED ORDER — CHLORHEXIDINE GLUCONATE 4 % EX LIQD
60.0000 mL | Freq: Once | CUTANEOUS | Status: AC
  Administered 2013-09-28: 4 via TOPICAL
  Filled 2013-09-27: qty 60

## 2013-09-27 MED ORDER — LATANOPROST 0.005 % OP SOLN
1.0000 [drp] | Freq: Every day | OPHTHALMIC | Status: DC
  Administered 2013-09-28 – 2013-09-30 (×3): 1 [drp] via OPHTHALMIC
  Filled 2013-09-27: qty 2.5

## 2013-09-27 MED ORDER — DONEPEZIL HCL 10 MG PO TABS
10.0000 mg | ORAL_TABLET | Freq: Every morning | ORAL | Status: DC
  Administered 2013-09-29 – 2013-10-01 (×3): 10 mg via ORAL
  Filled 2013-09-27 (×4): qty 1

## 2013-09-27 MED ORDER — CEFAZOLIN SODIUM-DEXTROSE 2-3 GM-% IV SOLR
2.0000 g | INTRAVENOUS | Status: AC
  Administered 2013-09-28: 2 g via INTRAVENOUS
  Filled 2013-09-27: qty 50

## 2013-09-27 MED ORDER — DEXTROSE-NACL 5-0.45 % IV SOLN
INTRAVENOUS | Status: DC
Start: 2013-09-27 — End: 2013-10-01
  Administered 2013-09-27: 17:00:00 via INTRAVENOUS

## 2013-09-27 NOTE — ED Notes (Signed)
Bed: WA02 Expected date:  Expected time:  Means of arrival:  Comments: EMS/hip deformity

## 2013-09-27 NOTE — Consult Note (Addendum)
ORTHOPAEDIC CONSULTATION  REQUESTING PHYSICIAN: Costin Karlyne Greenspan, MD  Chief Complaint: right hip fracture  HPI: Shelley Adams is a 80 y.o. female who complains of right hip fracture s/p mechanical fall at home.  Patient has advanced dementia and walks minimally with a walker.  Has 24 hour care.  Per the daughter, the patient does not c/o CP, LOC, neck pain, abd pain.    Past Medical History  Diagnosis Date  . Hypertension   . Hyperlipidemia     history of  . Osteopenia   . Diverticulosis     history of  . Dementia   . Glaucoma   . Cancer     endometrial   Past Surgical History  Procedure Laterality Date  . Cataract extraction, bilateral    . Oophorectomy      left  . Tonsillectomy    . Vaginal hysterectomy  11/21/2011    Procedure: HYSTERECTOMY VAGINAL;  Surgeon: Alvino Chapel, MD;  Location: [facility];  Service: Gynecology;  Laterality: N/A;  . Abdominal hysterectomy  2012    Dr Meyer Cory   History   Social History  . Marital Status: Widowed    Spouse Name: N/A    Number of Children: N/A  . Years of Education: N/A   Social History Main Topics  . Smoking status: Never Smoker   . Smokeless tobacco: None  . Alcohol Use: No  . Drug Use: No  . Sexual Activity: Not Currently   Other Topics Concern  . None   Social History Narrative  . None   Family History  Problem Relation Age of Onset  . Coronary artery disease Father   . Transient ischemic attack Father   . Heart attack Brother   . Diabetes Daughter   . Osteoporosis Daughter   . Cancer Daughter     breast cancer  . Parkinsonism Sister    No Known Allergies Prior to Admission medications   Medication Sig Start Date End Date Taking? Authorizing Provider  Cranberry-Vitamin C-Vitamin E (CRANBERRY PLUS VITAMIN C) 4200-20-3 MG-MG-UNIT CAPS Take 1 capsule by mouth every morning.   Yes Historical Provider, MD  donepezil (ARICEPT) 10 MG tablet Take 10 mg by mouth every morning.   Yes  Historical Provider, MD  latanoprost (XALATAN) 0.005 % ophthalmic solution Place 1 drop into the right eye at bedtime.  07/02/13  Yes Historical Provider, MD  mirtazapine (REMERON) 15 MG tablet Take 15 mg by mouth at bedtime. For appetite   Yes Historical Provider, MD  Multiple Vitamin (MULTIVITAMIN WITH MINERALS) TABS Take 1 tablet by mouth daily with breakfast.   Yes Historical Provider, MD  naproxen sodium (ANAPROX) 220 MG tablet Take 440 mg by mouth 2 (two) times daily as needed.   Yes Historical Provider, MD  saccharomyces boulardii (FLORASTOR) 250 MG capsule Take 250 mg by mouth every morning. 09/16/13  Yes Mahima Pandey, MD  sulfamethoxazole-trimethoprim (BACTRIM DS) 800-160 MG per tablet Take 1 tablet by mouth 2 (two) times daily. 09/18/13 09/29/13 Yes Mahima Pandey, MD  traMADol (ULTRAM) 50 MG tablet Take 1 tablet (50 mg total) by mouth every 8 (eight) hours as needed (pain). 09/22/13  Yes Tiffany L Reed, DO  triamterene-hydrochlorothiazide (MAXZIDE-25) 37.5-25 MG per tablet Take 1 tablet by mouth every morning.  07/23/13  Yes Historical Provider, MD   Dg Chest 1 View  09/27/2013   CLINICAL DATA:  [place] last night, right hip pain, history of hypertension  EXAM: CHEST - 1 VIEW  COMPARISON:  DG CHEST 2 VIEW dated 11/17/2011  FINDINGS: Heart size and vascular pattern are normal. No consolidation effusion or edema.  IMPRESSION: No active disease.   Electronically Signed   By: Skipper Cliche M.D.   On: 09/27/2013 12:23   Dg Hip Complete Right  09/27/2013   CLINICAL DATA:  Fall, hip pain  EXAM: RIGHT HIP - COMPLETE 2+ VIEW  COMPARISON:  None.  FINDINGS: Acute transcervical femoral neck fracture on the right. The femoral head remains located in the acetabulum. The visualized bony pelvis is intact. Advanced degenerative change noted in the mid lumbar spine. There is significant left lateral translation of L2 on L3 with associated chronic degenerative changes. Moderate colonic stool burden. The bones are  osteopenic.  IMPRESSION: Acute mildly impacted transcervical femoral neck fracture on the right.  The bones are osteopenic.   Electronically Signed   By: Jacqulynn Cadet M.D.   On: 09/27/2013 12:27   Ct Head Wo Contrast  09/27/2013   CLINICAL DATA:  Dementia, patient fell during the night  EXAM: CT HEAD WITHOUT CONTRAST  TECHNIQUE: Contiguous axial images were obtained from the base of the skull through the vertex without intravenous contrast.  COMPARISON:  CT HEAD W/O CM dated 09/12/2012; CT C SPINE W/O CM dated 09/12/2012  FINDINGS: Significant diffuse atrophy. Low attenuation in the deep white matter. No evidence of vascular territory infarct, mass, hemorrhage, or extra-axial fluid. Calvarium is intact.  IMPRESSION: Significant chronic involutional change.  No acute findings.   Electronically Signed   By: Skipper Cliche M.D.   On: 09/27/2013 12:33    Positive ROS: All other systems have been reviewed and were otherwise negative with the exception of those mentioned in the HPI and as above.  Physical Exam: General: NAD Cardiovascular: No pedal edema Respiratory: No cyanosis, no use of accessory musculature GI: No organomegaly, abdomen is soft and non-tender Skin: No lesions in the area of chief complaint Neurologic: Sensation intact distally Psychiatric: pleasantly demented Lymphatic: No axillary or cervical lymphadenopathy  MUSCULOSKELETAL:  - very painful ROM of hip or with movement of patient - moves foot, toes, ankle spontaneously - foot wwp - skin intact  Assessment: Right displaced femoral neck fracture  Plan: - discussed at length with patient's 2 daughters about the r/b/a to partial hip replacement - although patient is minimally ambulatory, she is in significant amount of pain with any movement, surgery is expected to help with this and allow her to transfer with minimal discomfort - did discuss the risks in detail including infection, neurovascular injury, dislocation, heart  attack, stroke, death, etc. - all questions answered to family's satisfaction - will proceed with surgery  - Based on history and fracture pattern this likely represents a fragility fracture. - Fragility fractures affect up to one half of women and one third of men after age 56 years and occur in the setting of bone disorder such as osteoporosis or osteopenia and warrant appropriate work-up. - The following are general recommendations that may serve as an outline for an appropriate work-up:  1.) Obtain bone density measurement to confirm presumptive diagnosis, assess severity of osteoporosis and risk of future fracture, and use as baseline for monitoring treatment  2.) Obtain laboratory tests: CBC, ESR, serum calcium, creatinine, albumin,phosphate, alkaline phosphatase, liver transaminases, protein electrophoresis, urinalysis, 25-hydroxyvitamin D.  3.) Exclude secondary causes of low bone mass and skeletal fragility (eg,multiple myeloma, lymphoma) as indicated.  4.) Obtain radiograph of thoracic and lumbar spine, particularly among individuals with back pain  or height loss to assess presence of vertebral fractures  5.) Intermittent administration of recombinant human parathyroid hormone  6.) Optimize nutritional status using nutritional supplementation.  7.) Patient/family education to prevent future falls.  8.) Early mobilization and exercise program - exercise decreases the rate of bone loss and has been associated with decreased rate of fragility fractures   Thank you for the consult and the opportunity to see Ms. Sanmiguel  N. Eduard Roux, MD [place] 5:35 PM

## 2013-09-27 NOTE — H&P (Signed)
History and Physical    Shelley Adams DJT:[id number] DOB: Sep 22, 1922 DOA: 09/27/2013  Referring physician: Dr. Aline Brochure PCP: Delrae Rend, NP  Specialists: Orthopedic surgery  Chief Complaint: hip pain  HPI: Shelley Adams is a 80 y.o. female has a past medical history significant for HTN, advanced dementia, malnutrition, comes in after suffering a mechanical fall last night and endorsing right hip pain this morning. Daughter is in the room and is able to provide most of the story. They have an 24 hour aide who lives with the patient and last night patient was trying to reach her walker however fell on the floor. At baseline, patient is minimally ambulatory and lays in bed most of the day, and she often will try to get up from her bed to go to the bathroom by herself without telling anyone. As far as the daughter knows, patient did not complain of chest pain, breathing difficulties, fever/chills, no reported abdominal pain, nausea vomiting or diarrhea and no syncopal episodes. She has a history of recurrent UTIs and is currently on Bactrim which she was supposed to stop few days ago but she is still taking it. Patient is alert in the ED, pleasantly demented and oriented to self only. She cannot contribute to the story. In the ED, labs show mild AKI, and a right hip film is positive for acute mildly impacted transcervical femoral neck fracture on the right. Orthopedic surgery (Dr. Lynann Bologna with [facility]) has been consulted by EDP and [facility] asked to admit.   Review of Systems: unable to obtain ROS due to advanced dementia  Past Medical History  Diagnosis Date  . Hypertension   . Hyperlipidemia     history of  . Osteopenia   . Diverticulosis     history of  . Dementia   . Glaucoma   . Cancer     endometrial   Past Surgical History  Procedure Laterality Date  . Cataract extraction, bilateral    . Oophorectomy      left  . Tonsillectomy    . Vaginal hysterectomy   11/21/2011    Procedure: HYSTERECTOMY VAGINAL;  Surgeon: Alvino Chapel, MD;  Location: [facility];  Service: Gynecology;  Laterality: N/A;  . Abdominal hysterectomy  2012    Dr Meyer Cory   Social History:  reports that she has never smoked. She does not have any smokeless tobacco history on file. She reports that she does not drink alcohol or use illicit drugs.  No Known Allergies  Family History  Problem Relation Age of Onset  . Coronary artery disease Father   . Transient ischemic attack Father   . Heart attack Brother   . Diabetes Daughter   . Osteoporosis Daughter   . Cancer Daughter     breast cancer  . Parkinsonism Sister    Prior to Admission medications   Medication Sig Start Date End Date Taking? Authorizing Provider  Cranberry-Vitamin C-Vitamin E (CRANBERRY PLUS VITAMIN C) 4200-20-3 MG-MG-UNIT CAPS Take 1 capsule by mouth every morning.   Yes Historical Provider, MD  donepezil (ARICEPT) 10 MG tablet Take 10 mg by mouth every morning.   Yes Historical Provider, MD  latanoprost (XALATAN) 0.005 % ophthalmic solution Place 1 drop into the right eye at bedtime.  07/02/13  Yes Historical Provider, MD  mirtazapine (REMERON) 15 MG tablet Take 15 mg by mouth at bedtime. For appetite   Yes Historical Provider, MD  Multiple Vitamin (MULTIVITAMIN WITH MINERALS) TABS Take 1 tablet by  mouth daily with breakfast.   Yes Historical Provider, MD  naproxen sodium (ANAPROX) 220 MG tablet Take 440 mg by mouth 2 (two) times daily as needed.   Yes Historical Provider, MD  saccharomyces boulardii (FLORASTOR) 250 MG capsule Take 250 mg by mouth every morning. 09/16/13  Yes Mahima Pandey, MD  sulfamethoxazole-trimethoprim (BACTRIM DS) 800-160 MG per tablet Take 1 tablet by mouth 2 (two) times daily. 09/18/13 09/29/13 Yes Mahima Pandey, MD  traMADol (ULTRAM) 50 MG tablet Take 1 tablet (50 mg total) by mouth every 8 (eight) hours as needed (pain). 09/22/13  Yes Tiffany L Reed, DO    triamterene-hydrochlorothiazide (MAXZIDE-25) 37.5-25 MG per tablet Take 1 tablet by mouth every morning.  07/23/13  Yes Historical Provider, MD   Physical Exam: Filed Vitals:   09/27/13 1121 09/27/13 1125 09/27/13 1345  BP:  123/62 121/69  Pulse:  80 80  Temp:  97.8 F (36.6 C) 97.5 F (36.4 C)  TempSrc:  Oral Oral  Resp:  16 19  SpO2: 95% 93% 92%     General:  No apparent distress, pleasantly demented, appears to have intermittent hallucinations and tries to pick at things with her hands; cachectic appearing  Eyes: no scleral icterus  ENT: moist oropharynx  Neck: supple, no JVD  Cardiovascular: regular rate without MRG; 2+ peripheral pulses  Respiratory: CTA biL, good air movement without wheezing, rhonchi or crackled  Abdomen: soft, non tender to palpation, positive bowel sounds, no guarding, no rebound  Skin: no rashes  Musculoskeletal: no peripheral edema  Psychiatric: normal mood and affect  Neurologic: non focal  Labs on Admission:  Basic Metabolic Panel:  Recent Labs Lab 09/27/13 1245  NA 140  K 4.1  CL 101  CO2 28  GLUCOSE 102*  BUN 33*  CREATININE 0.95  CALCIUM 9.2   CBC:  Recent Labs Lab 09/27/13 1245  WBC 9.9  NEUTROABS 8.2*  HGB 13.5  HCT 40.4  MCV 85.2  PLT 227   Radiological Exams on Admission: Dg Chest 1 View  09/27/2013   CLINICAL DATA:  [place] last night, right hip pain, history of hypertension  EXAM: CHEST - 1 VIEW  COMPARISON:  DG CHEST 2 VIEW dated 11/17/2011  FINDINGS: Heart size and vascular pattern are normal. No consolidation effusion or edema.  IMPRESSION: No active disease.   Electronically Signed   By: Skipper Cliche M.D.   On: 09/27/2013 12:23   Dg Hip Complete Right  09/27/2013   CLINICAL DATA:  Fall, hip pain  EXAM: RIGHT HIP - COMPLETE 2+ VIEW  COMPARISON:  None.  FINDINGS: Acute transcervical femoral neck fracture on the right. The femoral head remains located in the acetabulum. The visualized bony pelvis is intact.  Advanced degenerative change noted in the mid lumbar spine. There is significant left lateral translation of L2 on L3 with associated chronic degenerative changes. Moderate colonic stool burden. The bones are osteopenic.  IMPRESSION: Acute mildly impacted transcervical femoral neck fracture on the right.  The bones are osteopenic.   Electronically Signed   By: Jacqulynn Cadet M.D.   On: 09/27/2013 12:27   Ct Head Wo Contrast  09/27/2013   CLINICAL DATA:  Dementia, patient fell during the night  EXAM: CT HEAD WITHOUT CONTRAST  TECHNIQUE: Contiguous axial images were obtained from the base of the skull through the vertex without intravenous contrast.  COMPARISON:  CT HEAD W/O CM dated 09/12/2012; CT C SPINE W/O CM dated 09/12/2012  FINDINGS: Significant diffuse atrophy. Low attenuation in  the deep white matter. No evidence of vascular territory infarct, mass, hemorrhage, or extra-axial fluid. Calvarium is intact.  IMPRESSION: Significant chronic involutional change.  No acute findings.   Electronically Signed   By: Skipper Cliche M.D.   On: 09/27/2013 12:33    EKG: Independently reviewed. Sinus rhythm  Assessment/Plan Active Problems:   Closed right hip fracture   Hip fracture   Advanced dementia   Protein calorie malnutrition   Hypertension   Hip fracture - orthopedic surgery consulted, appreciate input.   HTN - will hold BP meds, BP normal in the ED, monitor.   Protein calorie malnutrition - likely severe, consult nutrition.   Advanced dementia - patient progressively worse in the past months few years, poor po intake, significant weight loss and cachectic. Daughter asking about hospice, it may be reasonable to consider.   Recurrent UTIs - urinalysis in the ED unremarkable, will not continue Bactrim.   Diet: NPO until evaluated by ortho Fluids: D5 half normal DVT Prophylaxis: SCDs  Code Status: DNR/DNI, discussed with patient's daughter she has advanced directives, no feeding tubes or  aggressive interventions.  Family Communication: daughter bedside  Disposition Plan: inpatient  Time spent: 110  This note has been created with Surveyor, quantity. Any transcriptional errors are unintentional.   Costin M. Cruzita Lederer, MD [facility] Pager [phone]  If 7PM-7AM, please contact night-coverage [email or web address] Password [email or web address] 09/27/2013, 2:46 PM

## 2013-09-27 NOTE — ED Notes (Signed)
Per EMS, pt from home. Hx dementia with aide that stays with pt. Adie reports pt got up in the middle of the night and had a fall. Was put back in bed. When daughter checked on pt in the morning pt was moaning in pain with movement. R foot externally rotated. 12mcg fentayl given by EMS prior to arrival.

## 2013-09-27 NOTE — ED Provider Notes (Addendum)
CSN: [id number]     Arrival date & time 09/27/13  1119 History   First MD Initiated Contact with Patient 09/27/13 1138     Chief Complaint  Patient presents with  . Hip Injury     (Consider location/radiation/quality/duration/timing/severity/associated sxs/prior Treatment) Patient is a 80 y.o. female presenting with fall. History provided by: caretaker, daughter.  Fall This is a new problem. The current episode started yesterday. Episode frequency: once. The problem has been resolved. Pertinent negatives include no chest pain, no abdominal pain, no headaches and no shortness of breath. The symptoms are aggravated by walking. Nothing relieves the symptoms. Treatments tried: fentanyl. The treatment provided significant relief.    Past Medical History  Diagnosis Date  . Hypertension   . Hyperlipidemia     history of  . Osteopenia   . Diverticulosis     history of  . Dementia   . Glaucoma   . Cancer     endometrial   Past Surgical History  Procedure Laterality Date  . Cataract extraction, bilateral    . Oophorectomy      left  . Tonsillectomy    . Vaginal hysterectomy  11/21/2011    Procedure: HYSTERECTOMY VAGINAL;  Surgeon: Alvino Chapel, MD;  Location: [facility];  Service: Gynecology;  Laterality: N/A;  . Abdominal hysterectomy  2012    Dr Meyer Cory   Family History  Problem Relation Age of Onset  . Coronary artery disease Father   . Transient ischemic attack Father   . Heart attack Brother   . Diabetes Daughter   . Osteoporosis Daughter   . Cancer Daughter     breast cancer  . Parkinsonism Sister    History  Substance Use Topics  . Smoking status: Never Smoker   . Smokeless tobacco: Not on file  . Alcohol Use: No   OB History   Grav Para Term Preterm Abortions TAB SAB Ect Mult Living                 Review of Systems  Constitutional: Negative for fever and fatigue.  HENT: Negative for congestion and drooling.   Eyes: Negative for pain.   Respiratory: Negative for cough and shortness of breath.   Cardiovascular: Negative for chest pain.  Gastrointestinal: Negative for nausea, vomiting, abdominal pain and diarrhea.  Genitourinary: Negative for dysuria and hematuria.  Musculoskeletal: Negative for back pain, gait problem and neck pain.  Skin: Negative for color change.  Neurological: Negative for dizziness and headaches.  Hematological: Negative for adenopathy.  Psychiatric/Behavioral: Negative for behavioral problems.  All other systems reviewed and are negative.     Allergies  Review of patient's allergies indicates no known allergies.  Home Medications   Prior to Admission medications   Medication Sig Start Date End Date Taking? Authorizing Provider  Cranberry-Vitamin C-Vitamin E (CRANBERRY PLUS VITAMIN C) 4200-20-3 MG-MG-UNIT CAPS Take 1 capsule by mouth every morning.   Yes Historical Provider, MD  donepezil (ARICEPT) 10 MG tablet Take 10 mg by mouth every morning.   Yes Historical Provider, MD  latanoprost (XALATAN) 0.005 % ophthalmic solution Place 1 drop into the right eye at bedtime.  07/02/13  Yes Historical Provider, MD  mirtazapine (REMERON) 15 MG tablet Take 15 mg by mouth at bedtime. For appetite   Yes Historical Provider, MD  Multiple Vitamin (MULTIVITAMIN WITH MINERALS) TABS Take 1 tablet by mouth daily with breakfast.   Yes Historical Provider, MD  naproxen sodium (ANAPROX) 220 MG tablet Take 440 mg by  mouth 2 (two) times daily as needed.   Yes Historical Provider, MD  saccharomyces boulardii (FLORASTOR) 250 MG capsule Take 250 mg by mouth every morning. 09/16/13  Yes Mahima Pandey, MD  sulfamethoxazole-trimethoprim (BACTRIM DS) 800-160 MG per tablet Take 1 tablet by mouth 2 (two) times daily. 09/18/13 09/29/13 Yes Mahima Pandey, MD  traMADol (ULTRAM) 50 MG tablet Take 1 tablet (50 mg total) by mouth every 8 (eight) hours as needed (pain). 09/22/13  Yes Tiffany L Reed, DO  triamterene-hydrochlorothiazide  (MAXZIDE-25) 37.5-25 MG per tablet Take 1 tablet by mouth every morning.  07/23/13  Yes Historical Provider, MD   BP 123/62  Pulse 80  Temp(Src) 97.8 F (36.6 C) (Oral)  Resp 16  SpO2 93% Physical Exam  Nursing note and vitals reviewed. Constitutional: She is oriented to person, place, and time. She appears well-developed and well-nourished.  HENT:  Head: Normocephalic and atraumatic.  Right Ear: External ear normal.  Left Ear: External ear normal.  Mouth/Throat: Oropharynx is clear and moist. No oropharyngeal exudate.  Eyes: Conjunctivae and EOM are normal. Pupils are equal, round, and reactive to light.  Neck: Normal range of motion. Neck supple.  No cervical spine tenderness to palpation.  Cardiovascular: Normal rate, regular rhythm, normal heart sounds and intact distal pulses.  Exam reveals no gallop and no friction rub.   No murmur heard. Pulmonary/Chest: Effort normal and breath sounds normal. No respiratory distress. She has no wheezes.  Abdominal: Soft. Bowel sounds are normal. There is no tenderness. There is no rebound and no guarding.  Musculoskeletal: She exhibits no edema and no tenderness.  Normal rom of left hip. Mild ttp of right lateral hip.   2+ distal pulses.   Neurological: She is alert and oriented to person, place, and time.  Skin: Skin is warm and dry.  Psychiatric: She has a normal mood and affect. Her behavior is normal.    ED Course  Procedures (including critical care time) Labs Review Labs Reviewed  CBC WITH DIFFERENTIAL - Abnormal; Notable for the following:    Neutrophils Relative % 83 (*)    Neutro Abs 8.2 (*)    Lymphocytes Relative 10 (*)    All other components within normal limits  URINALYSIS, ROUTINE W REFLEX MICROSCOPIC - Abnormal; Notable for the following:    APPearance CLOUDY (*)    Ketones, ur 15 (*)    All other components within normal limits  BASIC METABOLIC PANEL - Abnormal; Notable for the following:    Glucose, Bld 102 (*)     BUN 33 (*)    GFR calc non Af Amer 52 (*)    GFR calc Af Amer 60 (*)    All other components within normal limits  URINE CULTURE    Imaging Review Dg Chest 1 View  09/27/2013   CLINICAL DATA:  [place] last night, right hip pain, history of hypertension  EXAM: CHEST - 1 VIEW  COMPARISON:  DG CHEST 2 VIEW dated 11/17/2011  FINDINGS: Heart size and vascular pattern are normal. No consolidation effusion or edema.  IMPRESSION: No active disease.   Electronically Signed   By: Skipper Cliche M.D.   On: 09/27/2013 12:23   Dg Hip Complete Right  09/27/2013   CLINICAL DATA:  Fall, hip pain  EXAM: RIGHT HIP - COMPLETE 2+ VIEW  COMPARISON:  None.  FINDINGS: Acute transcervical femoral neck fracture on the right. The femoral head remains located in the acetabulum. The visualized bony pelvis is intact. Advanced degenerative change  noted in the mid lumbar spine. There is significant left lateral translation of L2 on L3 with associated chronic degenerative changes. Moderate colonic stool burden. The bones are osteopenic.  IMPRESSION: Acute mildly impacted transcervical femoral neck fracture on the right.  The bones are osteopenic.   Electronically Signed   By: Jacqulynn Cadet M.D.   On: 09/27/2013 12:27   Ct Head Wo Contrast  09/27/2013   CLINICAL DATA:  Dementia, patient fell during the night  EXAM: CT HEAD WITHOUT CONTRAST  TECHNIQUE: Contiguous axial images were obtained from the base of the skull through the vertex without intravenous contrast.  COMPARISON:  CT HEAD W/O CM dated 09/12/2012; CT C SPINE W/O CM dated 09/12/2012  FINDINGS: Significant diffuse atrophy. Low attenuation in the deep white matter. No evidence of vascular territory infarct, mass, hemorrhage, or extra-axial fluid. Calvarium is intact.  IMPRESSION: Significant chronic involutional change.  No acute findings.   Electronically Signed   By: Skipper Cliche M.D.   On: 09/27/2013 12:33     EKG Interpretation None      MDM   Final  diagnoses:  Closed right hip fracture  Fall    11:58 AM 80 y.o. female  w a hx of dementia who presents after a fall which occurred at about midnight last night. The patient was getting up to use the bathroom and fell on her right hip while trying to use her walker. She was at home with a 24-hour caretaker. They note that at the time she appeared well but this morning was complaining of right hip pain. She got fentanyl en route. She currently has no complaints on exam. Will get screening imaging and lab work.  Found to have right hip fx. Discussed w/ Dr. Lynann Bologna ([facility]) who will see the pt.   Will admit to [facility].   Blanchard Kelch, MD 09/27/13 [place], MD 09/27/13 [phone]

## 2013-09-27 NOTE — ED Notes (Addendum)
[place] - Daughter [phone]

## 2013-09-27 NOTE — Plan of Care (Signed)
Problem: Phase I Progression Outcomes Goal: Pre op Protime within normal limits Outcome: Not Met (add Reason) Not drawn, pt not on anticoagulant

## 2013-09-27 NOTE — ED Notes (Signed)
Patient transported to Radiology 

## 2013-09-28 ENCOUNTER — Inpatient Hospital Stay (HOSPITAL_COMMUNITY): Payer: Medicare Other

## 2013-09-28 ENCOUNTER — Encounter (HOSPITAL_COMMUNITY)
Admission: EM | Disposition: A | Discharge status: Skilled Nursing Facility | Payer: Self-pay | Source: Home / Self Care | Attending: Internal Medicine

## 2013-09-28 ENCOUNTER — Inpatient Hospital Stay (HOSPITAL_COMMUNITY): Payer: Medicare Other | Admitting: Anesthesiology

## 2013-09-28 ENCOUNTER — Encounter (HOSPITAL_COMMUNITY): Payer: Medicare Other | Admitting: Anesthesiology

## 2013-09-28 HISTORY — PX: HIP ARTHROPLASTY: SHX981

## 2013-09-28 LAB — CBC
HCT: 36.5 % (ref 36.0–46.0)
Hemoglobin: 12 g/dL (ref 12.0–15.0)
MCH: 28.2 pg (ref 26.0–34.0)
MCHC: 32.9 g/dL (ref 30.0–36.0)
MCV: 85.9 fL (ref 78.0–100.0)
PLATELETS: 192 10*3/uL (ref 150–400)
RBC: 4.25 MIL/uL (ref 3.87–5.11)
RDW: 14.2 % (ref 11.5–15.5)
WBC: 7.9 10*3/uL (ref 4.0–10.5)

## 2013-09-28 LAB — BASIC METABOLIC PANEL
BUN: 25 mg/dL — AB (ref 6–23)
CALCIUM: 8.5 mg/dL (ref 8.4–10.5)
CO2: 25 meq/L (ref 19–32)
CREATININE: 0.7 mg/dL (ref 0.50–1.10)
Chloride: 105 mEq/L (ref 96–112)
GFR calc Af Amer: 86 mL/min — ABNORMAL LOW (ref >90)
GFR calc non Af Amer: 75 mL/min — ABNORMAL LOW (ref >90)
Glucose, Bld: 92 mg/dL (ref 70–99)
Potassium: 3.8 mEq/L (ref 3.7–5.3)
Sodium: 141 mEq/L (ref 137–147)

## 2013-09-28 SURGERY — HEMIARTHROPLASTY, HIP, DIRECT ANTERIOR APPROACH, FOR FRACTURE
Anesthesia: General | Site: Hip | Laterality: Right

## 2013-09-28 MED ORDER — CEFAZOLIN SODIUM-DEXTROSE 2-3 GM-% IV SOLR
INTRAVENOUS | Status: AC
  Filled 2013-09-28: qty 50

## 2013-09-28 MED ORDER — FENTANYL CITRATE 0.05 MG/ML IJ SOLN
INTRAMUSCULAR | Status: AC
  Filled 2013-09-28: qty 2

## 2013-09-28 MED ORDER — ONDANSETRON HCL 4 MG PO TABS: 4.0000 mg | ORAL_TABLET | Freq: Four times a day (QID) | ORAL | Status: DC | PRN

## 2013-09-28 MED ORDER — PROMETHAZINE HCL 25 MG/ML IJ SOLN: 6.2500 mg | INTRAMUSCULAR | Status: DC | PRN

## 2013-09-28 MED ORDER — ALUM & MAG HYDROXIDE-SIMETH 200-200-20 MG/5ML PO SUSP: 30.0000 mL | ORAL | Status: DC | PRN

## 2013-09-28 MED ORDER — EPHEDRINE SULFATE 50 MG/ML IJ SOLN
INTRAMUSCULAR | Status: AC
  Filled 2013-09-28: qty 1

## 2013-09-28 MED ORDER — SENNA 8.6 MG PO TABS
1.0000 | ORAL_TABLET | Freq: Two times a day (BID) | ORAL | Status: DC
  Administered 2013-09-28 – 2013-10-01 (×7): 8.6 mg via ORAL

## 2013-09-28 MED ORDER — METHOCARBAMOL 500 MG PO TABS: 500.0000 mg | ORAL_TABLET | Freq: Four times a day (QID) | ORAL | Status: DC | PRN

## 2013-09-28 MED ORDER — ONDANSETRON HCL 4 MG/2ML IJ SOLN
INTRAMUSCULAR | Status: AC
  Filled 2013-09-28: qty 2

## 2013-09-28 MED ORDER — MORPHINE SULFATE 2 MG/ML IJ SOLN
0.5000 mg | INTRAMUSCULAR | Status: DC | PRN
Start: 2013-09-28 — End: 2013-10-01

## 2013-09-28 MED ORDER — ACETAMINOPHEN 10 MG/ML IV SOLN
1000.0000 mg | Freq: Once | INTRAVENOUS | Status: AC
  Administered 2013-09-28: 1000 mg via INTRAVENOUS
  Filled 2013-09-28: qty 100

## 2013-09-28 MED ORDER — GLYCOPYRROLATE 0.2 MG/ML IJ SOLN
INTRAMUSCULAR | Status: AC
  Filled 2013-09-28: qty 2

## 2013-09-28 MED ORDER — MENTHOL 3 MG MT LOZG: 1.0000 | LOZENGE | OROMUCOSAL | Status: DC | PRN

## 2013-09-28 MED ORDER — PROPOFOL 10 MG/ML IV BOLUS
INTRAVENOUS | Status: AC
  Filled 2013-09-28: qty 20

## 2013-09-28 MED ORDER — METHOCARBAMOL 1000 MG/10ML IJ SOLN
500.0000 mg | Freq: Four times a day (QID) | INTRAMUSCULAR | Status: DC | PRN
  Filled 2013-09-28: qty 5

## 2013-09-28 MED ORDER — SUCCINYLCHOLINE CHLORIDE 20 MG/ML IJ SOLN
INTRAMUSCULAR | Status: DC | PRN
  Administered 2013-09-28: 60 mg via INTRAVENOUS

## 2013-09-28 MED ORDER — STERILE WATER FOR IRRIGATION IR SOLN
Status: DC | PRN
  Administered 2013-09-28: 1500 mL

## 2013-09-28 MED ORDER — LACTATED RINGERS IV SOLN
INTRAVENOUS | Status: DC | PRN
  Administered 2013-09-28: 09:00:00 via INTRAVENOUS

## 2013-09-28 MED ORDER — PROPOFOL 10 MG/ML IV BOLUS
INTRAVENOUS | Status: DC | PRN
  Administered 2013-09-28: 60 mg via INTRAVENOUS

## 2013-09-28 MED ORDER — EPHEDRINE SULFATE 50 MG/ML IJ SOLN
INTRAMUSCULAR | Status: DC | PRN
  Administered 2013-09-28 (×6): 5 mg via INTRAVENOUS

## 2013-09-28 MED ORDER — HYDROCODONE-ACETAMINOPHEN 5-325 MG PO TABS: 1.0000 | ORAL_TABLET | Freq: Four times a day (QID) | ORAL | Status: DC | PRN

## 2013-09-28 MED ORDER — GLYCOPYRROLATE 0.2 MG/ML IJ SOLN
INTRAMUSCULAR | Status: DC | PRN
  Administered 2013-09-28: 0.4 mg via INTRAVENOUS

## 2013-09-28 MED ORDER — OXYCODONE HCL 5 MG PO TABS: 5.0000 mg | ORAL_TABLET | ORAL | Status: DC | PRN

## 2013-09-28 MED ORDER — SODIUM CHLORIDE 0.9 % IV SOLN
INTRAVENOUS | Status: DC
  Administered 2013-09-28 (×2): via INTRAVENOUS
  Administered 2013-09-29: 125 mL/h via INTRAVENOUS
  Administered 2013-09-29: 03:00:00 via INTRAVENOUS
  Administered 2013-09-30: 50 mL/h via INTRAVENOUS

## 2013-09-28 MED ORDER — ACETAMINOPHEN 325 MG PO TABS
650.0000 mg | ORAL_TABLET | Freq: Four times a day (QID) | ORAL | Status: DC | PRN
  Administered 2013-09-28 – 2013-10-01 (×6): 650 mg via ORAL
  Filled 2013-09-28 (×6): qty 2

## 2013-09-28 MED ORDER — ENOXAPARIN SODIUM 40 MG/0.4ML ~~LOC~~ SOLN
40.0000 mg | SUBCUTANEOUS | Status: DC
  Administered 2013-09-29 – 2013-10-01 (×3): 40 mg via SUBCUTANEOUS
  Filled 2013-09-28 (×4): qty 0.4

## 2013-09-28 MED ORDER — FENTANYL CITRATE 0.05 MG/ML IJ SOLN
INTRAMUSCULAR | Status: DC | PRN
  Administered 2013-09-28 (×7): 25 ug via INTRAVENOUS

## 2013-09-28 MED ORDER — SORBITOL 70 % SOLN
30.0000 mL | Freq: Every day | Status: DC | PRN
  Filled 2013-09-28: qty 30

## 2013-09-28 MED ORDER — ENOXAPARIN SODIUM 40 MG/0.4ML ~~LOC~~ SOLN: 40.0000 mg | Freq: Every day | SUBCUTANEOUS | Status: DC

## 2013-09-28 MED ORDER — NEOSTIGMINE METHYLSULFATE 10 MG/10ML IV SOLN
INTRAVENOUS | Status: DC | PRN
  Administered 2013-09-28: 3 mg via INTRAVENOUS

## 2013-09-28 MED ORDER — CEFAZOLIN SODIUM-DEXTROSE 2-3 GM-% IV SOLR
2.0000 g | Freq: Four times a day (QID) | INTRAVENOUS | Status: AC
  Administered 2013-09-28 – 2013-09-29 (×3): 2 g via INTRAVENOUS
  Filled 2013-09-28 (×3): qty 50

## 2013-09-28 MED ORDER — METOCLOPRAMIDE HCL 10 MG PO TABS: 5.0000 mg | ORAL_TABLET | Freq: Three times a day (TID) | ORAL | Status: DC | PRN

## 2013-09-28 MED ORDER — CISATRACURIUM BESYLATE (PF) 10 MG/5ML IV SOLN
INTRAVENOUS | Status: DC | PRN
  Administered 2013-09-28: 4 mg via INTRAVENOUS

## 2013-09-28 MED ORDER — MAGNESIUM CITRATE PO SOLN: 1.0000 | Freq: Once | ORAL | Status: AC | PRN

## 2013-09-28 MED ORDER — SODIUM CHLORIDE 0.9 % IJ SOLN
INTRAMUSCULAR | Status: AC
  Filled 2013-09-28: qty 10

## 2013-09-28 MED ORDER — ONDANSETRON HCL 4 MG/2ML IJ SOLN: 4.0000 mg | Freq: Four times a day (QID) | INTRAMUSCULAR | Status: DC | PRN

## 2013-09-28 MED ORDER — NEOSTIGMINE METHYLSULFATE 10 MG/10ML IV SOLN
INTRAVENOUS | Status: AC
  Filled 2013-09-28: qty 1

## 2013-09-28 MED ORDER — METOCLOPRAMIDE HCL 5 MG/ML IJ SOLN: 5.0000 mg | Freq: Three times a day (TID) | INTRAMUSCULAR | Status: DC | PRN

## 2013-09-28 MED ORDER — ACETAMINOPHEN 650 MG RE SUPP: 650.0000 mg | Freq: Four times a day (QID) | RECTAL | Status: DC | PRN

## 2013-09-28 MED ORDER — FENTANYL CITRATE 0.05 MG/ML IJ SOLN: 25.0000 ug | INTRAMUSCULAR | Status: DC | PRN

## 2013-09-28 MED ORDER — ONDANSETRON HCL 4 MG/2ML IJ SOLN
INTRAMUSCULAR | Status: DC | PRN
  Administered 2013-09-28: 2 mg via INTRAVENOUS

## 2013-09-28 MED ORDER — ENSURE COMPLETE PO LIQD
237.0000 mL | Freq: Three times a day (TID) | ORAL | Status: DC
  Administered 2013-09-28 – 2013-10-01 (×8): 237 mL via ORAL

## 2013-09-28 MED ORDER — LIDOCAINE HCL (CARDIAC) 20 MG/ML IV SOLN
INTRAVENOUS | Status: DC | PRN
  Administered 2013-09-28: 60 mg via INTRAVENOUS

## 2013-09-28 MED ORDER — LIDOCAINE HCL (CARDIAC) 20 MG/ML IV SOLN
INTRAVENOUS | Status: AC
  Filled 2013-09-28: qty 5

## 2013-09-28 MED ORDER — ENSURE COMPLETE PO LIQD: 237.0000 mL | Freq: Two times a day (BID) | ORAL | Status: DC

## 2013-09-28 MED ORDER — 0.9 % SODIUM CHLORIDE (POUR BTL) OPTIME
TOPICAL | Status: DC | PRN
  Administered 2013-09-28: 1000 mL

## 2013-09-28 MED ORDER — PHENOL 1.4 % MT LIQD: 1.0000 | OROMUCOSAL | Status: DC | PRN

## 2013-09-28 MED ORDER — POLYETHYLENE GLYCOL 3350 17 G PO PACK: 17.0000 g | PACK | Freq: Every day | ORAL | Status: DC | PRN

## 2013-09-28 SURGICAL SUPPLY — 53 items
BAG ZIPLOCK 12X15 (MISCELLANEOUS) ×3 IMPLANT
BLADE EXTENDED COATED 6.5IN (ELECTRODE) ×3 IMPLANT
BLADE HEX COATED 2.75 (ELECTRODE) ×3 IMPLANT
BLADE SAG 18X100X1.27 (BLADE) ×3 IMPLANT
BLADE SAW SAG 73X25 THK (BLADE)
BLADE SAW SGTL 73X25 THK (BLADE) IMPLANT
BRUSH FEMORAL CANAL (MISCELLANEOUS) IMPLANT
CEMENT ODC PLUS STEM (Cement) ×3 IMPLANT
DERMABOND ADVANCED (GAUZE/BANDAGES/DRESSINGS) ×2
DERMABOND ADVANCED .7 DNX12 (GAUZE/BANDAGES/DRESSINGS) ×1 IMPLANT
DRAPE HIP W/POCKET STRL (DRAPE) ×3 IMPLANT
DRAPE INCISE IOBAN 66X45 STRL (DRAPES) ×3 IMPLANT
DRAPE POUCH INSTRU U-SHP 10X18 (DRAPES) ×3 IMPLANT
DRAPE SURG 17X11 SM STRL (DRAPES) ×3 IMPLANT
DRAPE U-SHAPE 47X51 STRL (DRAPES) ×6 IMPLANT
DRSG AQUACEL AG ADV 3.5X10 (GAUZE/BANDAGES/DRESSINGS) ×3 IMPLANT
DRSG MEPILEX BORDER 4X12 (GAUZE/BANDAGES/DRESSINGS) IMPLANT
DRSG MEPILEX BORDER 4X8 (GAUZE/BANDAGES/DRESSINGS) IMPLANT
DRSG PAD ABDOMINAL 8X10 ST (GAUZE/BANDAGES/DRESSINGS) IMPLANT
ELECT REM PT RETURN 9FT ADLT (ELECTROSURGICAL) ×3
ELECTRODE REM PT RTRN 9FT ADLT (ELECTROSURGICAL) ×1 IMPLANT
EVACUATOR 1/8 PVC DRAIN (DRAIN) IMPLANT
FACESHIELD WRAPAROUND (MASK) ×9 IMPLANT
GLOVE BIOGEL PI IND STRL 7.5 (GLOVE) ×2 IMPLANT
GLOVE BIOGEL PI INDICATOR 7.5 (GLOVE) ×4
GOWN SRG XL XLNG 56XLVL 4 (GOWN DISPOSABLE) ×2 IMPLANT
GOWN STRL NON-REIN XL XLG LVL4 (GOWN DISPOSABLE) ×4
GOWN STRL REUS W/TWL XL LVL3 (GOWN DISPOSABLE) ×6 IMPLANT
HANDPIECE INTERPULSE COAX TIP (DISPOSABLE) ×2
HIP HEMIARTHROPLASTY LEV 1B ×3 IMPLANT
IMMOBILIZER KNEE 20 (SOFTGOODS) IMPLANT
NEEDLE MA TROC 1/2 (NEEDLE) IMPLANT
PACK TOTAL JOINT (CUSTOM PROCEDURE TRAY) ×3 IMPLANT
PASSER SUT SWANSON 36MM LOOP (INSTRUMENTS) IMPLANT
POSITIONER SURGICAL ARM (MISCELLANEOUS) ×3 IMPLANT
SET HNDPC FAN SPRY TIP SCT (DISPOSABLE) ×1 IMPLANT
SPONGE GAUZE 4X4 12PLY (GAUZE/BANDAGES/DRESSINGS) IMPLANT
SPONGE LAP 18X18 X RAY DECT (DISPOSABLE) IMPLANT
SPONGE LAP 4X18 X RAY DECT (DISPOSABLE) IMPLANT
STAPLER VISISTAT 35W (STAPLE) IMPLANT
SUT ETHIBOND #5 BRAIDED 30INL (SUTURE) ×3 IMPLANT
SUT ETHIBOND 2 (SUTURE) ×3 IMPLANT
SUT ETHIBOND NAB CT1 #1 30IN (SUTURE) ×6 IMPLANT
SUT FIBERWIRE #2 38 T-5 BLUE (SUTURE) ×6
SUT PDS AB 1 CT1 27 (SUTURE) ×6 IMPLANT
SUT VIC AB 1 CT1 36 (SUTURE) ×6 IMPLANT
SUT VIC AB 2 TP1 27 (SUTURE) ×6 IMPLANT
SUT VIC AB 2-0 CT1 27 (SUTURE) ×4
SUT VIC AB 2-0 CT1 TAPERPNT 27 (SUTURE) ×2 IMPLANT
SUTURE FIBERWR #2 38 T-5 BLUE (SUTURE) ×2 IMPLANT
TOWEL OR 17X26 10 PK STRL BLUE (TOWEL DISPOSABLE) ×6 IMPLANT
TOWER CARTRIDGE SMART MIX (DISPOSABLE) IMPLANT
TRAY FOLEY CATH 14FRSI W/METER (CATHETERS) IMPLANT

## 2013-09-28 NOTE — Progress Notes (Addendum)
INITIAL NUTRITION ASSESSMENT  DOCUMENTATION CODES Per approved criteria  -Severe malnutrition in the context of chronic illness -Underweight  Pt meets criteria for severe MALNUTRITION in the context of chronic illness as evidenced by severe fat and muscle wasting.  INTERVENTION: Ensure Complete po TID, each supplement provides 350 kcal and 13 grams of protein  NUTRITION DIAGNOSIS: Inadequate oral intake related to chronic illness as evidenced by reported intake meeting less than estimated needs.   Goal: Pt to meet >/= 90% of their estimated nutrition needs   Monitor:  Wt, po intake, acceptance of supplements  Reason for Assessment: Consult for nutrition assessment  80 y.o. female  Admitting Dx: <principal problem not specified>  ASSESSMENT: 80 y.o. female has a past medical history significant for HTN, advanced dementia, malnutrition, comes in after suffering a mechanical fall last night and endorsing right hip pain this morning.They have an 24 hour aide who lives with the patient and last night patient was trying to reach her walker however fell on the floor. At baseline, patient is minimally ambulatory and lays in bed most of the day, and she often will try to get up from her bed to go to the bathroom by herself without telling anyone.  Pt underwent surgical hip repair on 5/10.   Pt's daughter reports that pt eats very little, but that she drinks at least three Ensure drinks per day. She said that she recently learned about Ensure Complete, and will be changing to that. Pt has no problems with swallowing. Pt's weight has remained stable between 88-90 lbs.  Nutrition Focused Physical Exam:  Subcutaneous Fat:  Orbital Region: severe wasting Upper Arm Region: severe wasting Thoracic and Lumbar Region: severe wasting  Muscle:  Temple Region: severe wasting Clavicle Bone Region: severe wasting Clavicle and Acromion Bone Region: severe wasting Scapular Bone Region: n/a Dorsal  Hand: severe wasting Patellar Region: severe wasting Anterior Thigh Region: n/a Posterior Calf Region: n/a  Edema: none    Height: Ht Readings from Last 1 Encounters:  09/27/13 5\' 4"  (1.626 m)    Weight: Wt Readings from Last 1 Encounters:  09/27/13 90 lb (40.824 kg)    Ideal Body Weight: 54.7 kg  % Ideal Body Weight: 75%  Wt Readings from Last 10 Encounters:  09/27/13 90 lb (40.824 kg)  09/27/13 90 lb (40.824 kg)  09/16/13 89 lb (40.37 kg)  08/21/13 91 lb (41.277 kg)  07/10/13 90 lb 6.4 oz (41.005 kg)  11/21/12 93 lb 12.8 oz (42.547 kg)  04/03/12 89 lb 3.2 oz (40.461 kg)  01/12/12 92 lb 6.4 oz (41.912 kg)  11/21/11 88 lb 2.9 oz (40 kg)  11/21/11 88 lb 2.9 oz (40 kg)    Usual Body Weight: 88-90 lbs  % Usual Body Weight: 100%  BMI:  Body mass index is 15.44 kg/(m^2).  Estimated Nutritional Needs: Kcal: 1250-1450 Protein: 70-80 g Fluid: ~1.5 L/day  Skin: incision on right hip  Diet Order: General  EDUCATION NEEDS: -Education needs addressed   Intake/Output Summary (Last 24 hours) at 09/28/13 1204 Last data filed at 09/28/13 1013  Gross per 24 hour  Intake 1587.5 ml  Output   1175 ml  Net  412.5 ml    Last BM: prior to admission   Labs:   Recent Labs Lab 09/27/13 1245 09/28/13 0512  NA 140 141  K 4.1 3.8  CL 101 105  CO2 28 25  BUN 33* 25*  CREATININE 0.95 0.70  CALCIUM 9.2 8.5  GLUCOSE 102* 92  CBG (last 3)  No results found for this basename: GLUCAP,  in the last 72 hours  Scheduled Meds: .  ceFAZolin (ANCEF) IV  2 g Intravenous Q6H  . donepezil  10 mg Oral q morning - 10a  . [START ON 09/29/2013] enoxaparin (LOVENOX) injection  40 mg Subcutaneous Q24H  . latanoprost  1 drop Right Eye QHS  . senna  1 tablet Oral BID    Continuous Infusions: . sodium chloride 125 mL/hr at 09/28/13 1013  . dextrose 5 % and 0.45% NaCl 75 mL/hr at 09/27/13 1630    Past Medical History  Diagnosis Date  . Hypertension   . Hyperlipidemia      history of  . Osteopenia   . Diverticulosis     history of  . Dementia   . Glaucoma   . Cancer     endometrial    Past Surgical History  Procedure Laterality Date  . Cataract extraction, bilateral    . Oophorectomy      left  . Tonsillectomy    . Vaginal hysterectomy  11/21/2011    Procedure: HYSTERECTOMY VAGINAL;  Surgeon: Alvino Chapel, MD;  Location: [facility];  Service: Gynecology;  Laterality: N/A;  . Abdominal hysterectomy  2012    Dr Meyer Cory    Terrace Arabia RD, LDN

## 2013-09-28 NOTE — Transfer of Care (Signed)
Immediate Anesthesia Transfer of Care Note  Patient: Shelley Adams  Procedure(s) Performed: Procedure(s): ARTHROPLASTY BIPOLAR HIP (Right)  Patient Location: PACU  Anesthesia Type:General  Level of Consciousness: awake and patient cooperative  Airway & Oxygen Therapy: Patient Spontanous Breathing and Patient connected to face mask oxygen  Post-op Assessment: Report given to PACU RN and Post -op Vital signs reviewed and stable  Post vital signs: stable  Complications: No apparent anesthesia complications

## 2013-09-28 NOTE — Progress Notes (Signed)
Pt taken to OR via bed.Ancef on front of chart.U/O foley 200 cc prior to surgery.NS @ 112ml/hr infusing. NPO past MN. Dtr is in [place] holding area with OR nurse and pt.Consent for surgery and blood ,armband info confirmed. Shelley Adams

## 2013-09-28 NOTE — Anesthesia Postprocedure Evaluation (Signed)
  Anesthesia Post-op Note  Patient: Shelley Adams  Procedure(s) Performed: Procedure(s) (LRB): ARTHROPLASTY BIPOLAR HIP (Right)  Patient Location: PACU  Anesthesia Type: General  Level of Consciousness: awake and alert   Airway and Oxygen Therapy: Patient Spontanous Breathing  Post-op Pain: mild  Post-op Assessment: Post-op Vital signs reviewed, Patient's Cardiovascular Status Stable, Respiratory Function Stable, Patent Airway and No signs of Nausea or vomiting  Last Vitals:  Filed Vitals:   09/28/13 1015  BP: 125/67  Pulse: 97  Temp:   Resp: 19    Post-op Vital Signs: stable   Complications: No apparent anesthesia complications

## 2013-09-28 NOTE — Progress Notes (Addendum)
PROGRESS NOTE  Shelley Adams UUV:[id number] DOB: [birth year] DOA: 09/27/2013 PCP: Delrae Rend, NP  HPI: Shelley Adams is a 80 y.o. female has a past medical history significant for HTN, advanced dementia, malnutrition, comes in after suffering a mechanical fall, came in with hip fx.   Assessment/Plan: Hip fracture - orthopedic surgery consulted, s/p hip[ arthroplasty 5/10 am  HTN - will hold BP meds, BP normal post op, monitor.   Protein calorie malnutrition - likely severe, consult nutrition.   Advanced dementia - patient progressively worse in the past months few years, poor po intake, significant weight loss and cachectic. Daughter asking about hospice, it may be reasonable to consider.   Recurrent UTIs - urinalysis in the ED unremarkable, will not continue Bactrim.    Diet: advance as tolerated, to regular Fluids: NS DVT Prophylaxis: per ortho  Code Status: DNR Family Communication: daughter bedside  Disposition Plan: inpatient  Consultants:  Orthopedic surgery   Procedures:  Hip surgery    Antibiotics  Anti-infectives   Start     Dose/Rate Route Frequency Ordered Stop   09/28/13 0600  ceFAZolin (ANCEF) IVPB 2 g/50 mL premix     2 g 100 mL/hr over 30 Minutes Intravenous On call to O.R. 09/27/13 1559 09/29/13 0559     Antibiotics Given (last 72 hours)   None      HPI/Subjective: - she has no complaints, denies pain, seen post op in NAD  Objective: Filed Vitals:   09/27/13 1515 09/27/13 1530 09/27/13 2150 09/28/13 0548  BP: 110/70  95/63 112/72  Pulse: 78  73 70  Temp: 97.6 F (36.4 C)  97.5 F (36.4 C) 98 F (36.7 C)  TempSrc: Oral  Oral Oral  Resp: 24  14 16   Height:  5\' 4"  (1.626 m)    Weight:  40.824 kg (90 lb)    SpO2: 92%  91% 94%    Intake/Output Summary (Last 24 hours) at 09/28/13 0848 Last data filed at 09/28/13 0844  Gross per 24 hour  Intake  187.5 ml  Output    900 ml  Net -712.5 ml   Filed Weights   09/27/13  1530  Weight: 40.824 kg (90 lb)    Exam:   General:  NAD  Cardiovascular: regular rate and rhythm, without MRG  Respiratory: good air movement, clear to auscultation throughout, no wheezing, ronchi or rales  Abdomen: soft, not tender to palpation, positive bowel sounds  MSK: no peripheral edema  Neuro: non focal  Data Reviewed: Basic Metabolic Panel:  Recent Labs Lab 09/27/13 1245 09/28/13 0512  NA 140 141  K 4.1 3.8  CL 101 105  CO2 28 25  GLUCOSE 102* 92  BUN 33* 25*  CREATININE 0.95 0.70  CALCIUM 9.2 8.5   CBC:  Recent Labs Lab 09/27/13 1245 09/28/13 0512  WBC 9.9 7.9  NEUTROABS 8.2*  --   HGB 13.5 12.0  HCT 40.4 36.5  MCV 85.2 85.9  PLT 227 192    Recent Results (from the past 240 hour(s))  SURGICAL PCR SCREEN     Status: None   Collection Time    09/27/13  7:39 PM      Result Value Ref Range Status   MRSA, PCR NEGATIVE  NEGATIVE Final   Staphylococcus aureus NEGATIVE  NEGATIVE Final   Comment:            The Xpert SA Assay (FDA     approved for NASAL specimens  in patients over 26 years of age),     is one component of     a comprehensive surveillance     program.  Test performance has     been validated by [employer] for patients greater     than or equal to 85 year old.     It is not intended     to diagnose infection nor to     guide or monitor treatment.     Studies: Dg Chest 1 View  09/27/2013   CLINICAL DATA:  [place] last night, right hip pain, history of hypertension  EXAM: CHEST - 1 VIEW  COMPARISON:  DG CHEST 2 VIEW dated 11/17/2011  FINDINGS: Heart size and vascular pattern are normal. No consolidation effusion or edema.  IMPRESSION: No active disease.   Electronically Signed   By: Skipper Cliche M.D.   On: 09/27/2013 12:23   Dg Hip Complete Right  09/27/2013   CLINICAL DATA:  Fall, hip pain  EXAM: RIGHT HIP - COMPLETE 2+ VIEW  COMPARISON:  None.  FINDINGS: Acute transcervical femoral neck fracture on the right. The  femoral head remains located in the acetabulum. The visualized bony pelvis is intact. Advanced degenerative change noted in the mid lumbar spine. There is significant left lateral translation of L2 on L3 with associated chronic degenerative changes. Moderate colonic stool burden. The bones are osteopenic.  IMPRESSION: Acute mildly impacted transcervical femoral neck fracture on the right.  The bones are osteopenic.   Electronically Signed   By: Jacqulynn Cadet M.D.   On: 09/27/2013 12:27   Ct Head Wo Contrast  09/27/2013   CLINICAL DATA:  Dementia, patient fell during the night  EXAM: CT HEAD WITHOUT CONTRAST  TECHNIQUE: Contiguous axial images were obtained from the base of the skull through the vertex without intravenous contrast.  COMPARISON:  CT HEAD W/O CM dated 09/12/2012; CT C SPINE W/O CM dated 09/12/2012  FINDINGS: Significant diffuse atrophy. Low attenuation in the deep white matter. No evidence of vascular territory infarct, mass, hemorrhage, or extra-axial fluid. Calvarium is intact.  IMPRESSION: Significant chronic involutional change.  No acute findings.   Electronically Signed   By: Skipper Cliche M.D.   On: 09/27/2013 12:33    Scheduled Meds: .  ceFAZolin (ANCEF) IV  2 g Intravenous On Call to OR  . [MAR HOLD] donepezil  10 mg Oral q morning - 10a  . [MAR HOLD] latanoprost  1 drop Right Eye QHS   Continuous Infusions: . sodium chloride 125 mL/hr (09/28/13 0550)  . dextrose 5 % and 0.45% NaCl 75 mL/hr at 09/27/13 1630    Active Problems:   Closed right hip fracture   Hip fracture   Advanced dementia   Protein calorie malnutrition   Hypertension  Time spent: 35  This note has been created with Surveyor, quantity. Any transcriptional errors are unintentional.   Marzetta Board, MD [facility] Pager [phone]. If 7 PM - 7 AM, please contact night-coverage at [email or web address], password [facility] 09/28/2013, 8:48 AM  LOS: 1 day

## 2013-09-28 NOTE — Anesthesia Preprocedure Evaluation (Signed)
Anesthesia Evaluation  Patient identified by MRN, date of birth, ID band Patient awake    Reviewed: Allergy & Precautions, H&P , NPO status , Patient's Chart, lab work & pertinent test results  Airway Mallampati: II TM Distance: >3 FB Neck ROM: Limited    Dental no notable dental hx.    Pulmonary neg pulmonary ROS,  breath sounds clear to auscultation  Pulmonary exam normal       Cardiovascular hypertension, Pt. on medications Rhythm:Regular Rate:Normal     Neuro/Psych dementia negative neurological ROS  negative psych ROS   GI/Hepatic negative GI ROS, Neg liver ROS,   Endo/Other  negative endocrine ROS  Renal/GU negative Renal ROS  negative genitourinary   Musculoskeletal negative musculoskeletal ROS (+)   Abdominal   Peds negative pediatric ROS (+)  Hematology negative hematology ROS (+)   Anesthesia Other Findings   Reproductive/Obstetrics negative OB ROS                           Anesthesia Physical Anesthesia Plan  ASA: III  Anesthesia Plan: General   Post-op Pain Management:    Induction: Intravenous  Airway Management Planned: Oral ETT  Additional Equipment:   Intra-op Plan:   Post-operative Plan: Extubation in OR  Informed Consent: I have reviewed the patients History and Physical, chart, labs and discussed the procedure including the risks, benefits and alternatives for the proposed anesthesia with the patient or authorized representative who has indicated his/her understanding and acceptance.   Dental advisory given  Plan Discussed with: CRNA and Surgeon  Anesthesia Plan Comments:         Anesthesia Quick Evaluation

## 2013-09-28 NOTE — Op Note (Addendum)
ARTHROPLASTY BIPOLAR HIP  Shelley Adams   [id number]  Pre-op Diagnosis: Right displaced femoral neck      Post-op Diagnosis: same   Operative Procedures  1. Open treatment of femoral fracture, proximal end, neck, prosthetic replacement CPT [phone]  Personnel  Surgeon(s): Brylon Brenning Eduard Roux, MD   Anesthesia: general  Prosthesis: [employer] Femur: Omnifit Hfx 127 Head: 47 mm size: -3 Bearing Type: Monopolar  Date of Service: 09/28/2013  Indication: 80 y.o.. year old female who suffered a ground level fall and was found to have sustained a right displaced femoral neck hip fracture. She was found to have a hip fracture that was appropriate for operative management. We reviewed the risk and benefits with the family and they elected to proceed.  Procedure:  After informed consent was obtained and understanding of the risk were voiced including but not limited to bleeding, infection, damage to surrounding structures including nerves and vessels, blood clots, leg length inequality, dislocation and the failure to achieve desired results, including death, the operative extremity was marked with verbal confirmation of the patient in the holding area.   The patient was then brought to the operating room and transported to the operating room table and placed in the lateral decubitus position. The operative limb was then prepped and draped in the usual sterile fashion and preoperative antibiotics were administered.  A time out was performed prior to the start of surgery confirming the correct extremity, preoperative antibiotic administration, as well as team members, and that implants and instruments available for the case. Correct surgical site was also confirmed with preoperative radiographs. A standard posterior approach to the hip was performed. The piriformis tendon was tagged for later repair.  A capsulotomy was performed and the capsule tagged for later repair. The labrum was preserved.  I  then used a corkscrew to remove the femoral head from the acetabulum. The calcar was inspected and there was no fracture propagation.  We measured the head and proceeded to trial head sizes and found 47 mm to the the appropriate size. We then turned our attention to femoral preparation. We began sequential broaching to a size 7 stem. This size produced good fit and rotational stability. We placed the 127 degree trial neck -3 head. The hip was stable in extension and external rotation without impingement. The hip was stable in deep flexion. In 90 degrees of flexion and neutral abduction the hip was stable to 50 degrees of internal rotation. In a position of sleep with hip adducted across the body the hip was stable to 45 degrees of rotation.  We then turned back to the femur and tested the broach for stability and fit, and removed the trial broach. The final femoral implant was placed, and the trial head was again trialed for stability. We then irrigated and dried the trunion, and the final head was placed. We placed an 47 mm -3 head. The hip was again reduced, and taken through a range of motion and found to be stable as above. The hip was thoroughly irrigated, and a posterior capsular repair was performed with #5 Ethibond. The wound was irrigated with normal saline. The deep fascia was closed with 1 PDS, 0 vicryl for the deep fat layer, and 2.0 Vicryl Plus for the subcutaneous tissue. The skin was closed with staples. A sterile dressing was applied. The patient was awakened and transported to the recovery room in stable condition. All sponge, needle, and instrument counts were correct at the end of the  case.  Position: lateral decubitus   Complications: none.  Time Out: performed   Drains/Packing: none  Estimated blood loss: 250 cc  Returned to Recovery Room: in good condition.   Antibiotics: yes   Mechanical VTE (DVT) Prophylaxis: sequential compression devices, TED knee-high  Chemical VTE (DVT)  Prophylaxis: lovenox  Fluid Replacement  Crystalloid: see anesthesia record Blood: none  FFP: none   Specimens Removed: 1 to pathology   Sponge and Instrument Count Correct? yes   PACU: portable radiograph - low AP pelvis  Admission: inpatient status, start PT & OT POD#1  Plan/RTC: Return in 2 weeks for wound check.  Weight Bearing/Load Lower Extremity: full  Posterior hip precautions  N. Eduard Roux, MD [place] 9:33 AM

## 2013-09-29 ENCOUNTER — Encounter (HOSPITAL_COMMUNITY): Payer: Self-pay | Admitting: Orthopaedic Surgery

## 2013-09-29 LAB — BASIC METABOLIC PANEL
BUN: 18 mg/dL (ref 6–23)
CALCIUM: 7.7 mg/dL — AB (ref 8.4–10.5)
CO2: 22 mEq/L (ref 19–32)
CREATININE: 0.64 mg/dL (ref 0.50–1.10)
Chloride: 109 mEq/L (ref 96–112)
GFR calc Af Amer: 89 mL/min — ABNORMAL LOW (ref >90)
GFR calc non Af Amer: 77 mL/min — ABNORMAL LOW (ref >90)
Glucose, Bld: 116 mg/dL — ABNORMAL HIGH (ref 70–99)
Potassium: 3.7 mEq/L (ref 3.7–5.3)
Sodium: 139 mEq/L (ref 137–147)

## 2013-09-29 LAB — CBC
HEMATOCRIT: 27.8 % — AB (ref 36.0–46.0)
Hemoglobin: 9.1 g/dL — ABNORMAL LOW (ref 12.0–15.0)
MCH: 28.5 pg (ref 26.0–34.0)
MCHC: 32.7 g/dL (ref 30.0–36.0)
MCV: 87.1 fL (ref 78.0–100.0)
PLATELETS: 188 10*3/uL (ref 150–400)
RBC: 3.19 MIL/uL — ABNORMAL LOW (ref 3.87–5.11)
RDW: 14.5 % (ref 11.5–15.5)
WBC: 6.6 10*3/uL (ref 4.0–10.5)

## 2013-09-29 LAB — URINE CULTURE
CULTURE: NO GROWTH
Colony Count: NO GROWTH

## 2013-09-29 MED ORDER — SODIUM CHLORIDE 0.9 % IV BOLUS (SEPSIS)
500.0000 mL | Freq: Once | INTRAVENOUS | Status: AC
Start: 2013-09-29 — End: 2013-09-29
  Administered 2013-09-29: 500 mL via INTRAVENOUS

## 2013-09-29 NOTE — Progress Notes (Signed)
09/29/13 1300  PT Visit Information  Last PT Received On 09/29/13  Assistance Needed +2  History of Present Illness 80 yo female s/p right hip hemiarthroplasty 09/26/13 following mechanical fall resulting in hip fx prior to adm; PMHx: HTN, dementia  PT Time Calculation  PT Start Time 1215  PT Stop Time 1226  PT Time Calculation (min) 11 min  Subjective Data  Patient Stated Goal walk again  Precautions  Precautions Posterior Hip  Restrictions  RLE Weight Bearing WBAT  Cognition  Arousal/Alertness Awake/alert  Overall Cognitive Status History of cognitive impairments - at baseline  Memory Decreased recall of precautions  Bed Mobility  Overal bed mobility Needs Assistance  Bed Mobility Sit to Supine  Sit to supine Mod assist;+2 for physical assistance  General bed mobility comments assist with UB and LEs  Transfers  Overall transfer level Needs assistance  Equipment used None  Transfers Stand Pivot Transfers  Sit to Stand Total assist  Stand pivot transfers Total assist;+2 physical assistance  General transfer comment assist to come to stand and pivot/wt shift, able to wt bear thru LEs more this pm  PT - End of Session  Equipment Utilized During Treatment Gait belt  Activity Tolerance Patient tolerated treatment well  Patient left in chair;with call bell/phone within reach;with nursing/sitter in room  Nurse Communication Mobility status  PT - Assessment/Plan  PT Plan Current plan remains appropriate  PT Frequency Min 3X/week  Follow Up Recommendations SNF  PT equipment None recommended by PT  PT Goal Progression  Progress towards PT goals Progressing toward goals  Acute Rehab PT Goals  Time For Goal Achievement 10/06/13  Potential to Achieve Goals Good  PT General Charges  $$ ACUTE PT VISIT 1 Procedure  PT Treatments  $Therapeutic Activity 8-22 mins

## 2013-09-29 NOTE — Progress Notes (Signed)
Clinical Social Work  CSW went to room to provide bed offers. No family or caregivers present. CSW left bed offer list in room and called dtr. CSW gave bed offers via phone. Dtr agreeable to tour facilities tonight and to have a decision tomorrow.  CSW will continue to follow.  Sindy Messing, LCSW (Coverage for [employer])

## 2013-09-29 NOTE — Evaluation (Signed)
Physical Therapy Evaluation Patient Details Name: Shelley Adams MRN: [id number] DOB: 14-Feb-1923 Today's Date: 09/29/2013   History of Present Illness  80 yo female s/p right hip hemiarthroplasty 09/26/13 following mechanical fall resulting in hip fx prior to adm; PMHx: HTN, dementia  Clinical Impression  Pt will likely benefit from SNF stay  To incr mobility and ease burden of care prior to return home with 24hr assist    Follow Up Recommendations SNF    Equipment Recommendations  None recommended by PT    Recommendations for Other Services       Precautions / Restrictions Precautions Precautions: Posterior Hip Restrictions RLE Weight Bearing: Weight bearing as tolerated      Mobility  Bed Mobility Overal bed mobility: Needs Assistance Bed Mobility: Supine to Sit     Supine to sit: Max assist;HOB elevated     General bed mobility comments: to EOB with OT  Transfers Overall transfer level: Needs assistance Equipment used: Rolling walker (2 wheeled) Transfers: Sit to/from [employer] Sit to Stand: +2 physical assistance;Total assist   Squat pivot transfers: Total assist (3/4 stand pivot)     General transfer comment: pt unable to come to full stand, significant posterior bias when standing attempted with pt extending LEs in front of her  Ambulation/Gait                Stairs            Wheelchair Mobility    Modified Rankin (Stroke Patients Only)       Balance Overall balance assessment: Needs assistance;History of Falls           Standing balance-Leahy Scale: Zero                               Pertinent Vitals/Pain Min c/o hip pain, "some but not too bad" back pain    Home Living Family/patient expects to be discharged to:: Private residence Living Arrangements: Non-relatives/Friends;Other (Comment) Available Help at Discharge: Personal care attendant;Available 24 hours/day Type of Home:  House Home Access: Stairs to enter   [employer] of Steps: 1 Home Layout: Two level;Able to live on main level with bedroom/bathroom Home Equipment: Walker - 2 wheels;Grab bars - tub/shower;Hand held shower head;Shower seat      Prior Function Level of Independence: Needs assistance   Gait / Transfers Assistance Needed: short distance household amb at baseline, uses RW and caregivers provide min/guard to supervision, more assist need for sit to stand transfers           Hand Dominance        Extremity/Trunk Assessment   Upper Extremity Assessment: Generalized weakness           Lower Extremity Assessment: RLE deficits/detail;Generalized weakness         Communication   Communication: No difficulties  Cognition Arousal/Alertness: Awake/alert   Overall Cognitive Status: History of cognitive impairments - at baseline       Memory: Decreased recall of precautions              General Comments      Exercises        Assessment/Plan    PT Assessment Patient needs continued PT services  PT Diagnosis Difficulty walking;Generalized weakness   PT Problem List Decreased strength;Decreased activity tolerance;Decreased balance;Decreased mobility;Decreased knowledge of precautions;Decreased knowledge of use of DME  PT Treatment Interventions Therapeutic activities;Therapeutic exercise;Functional mobility training;Gait training;DME instruction;Balance training;Patient/family  education   PT Goals (Current goals can be found in the Care Plan section) Acute Rehab PT Goals Patient Stated Goal: walk again PT Goal Formulation: With patient Time For Goal Achievement: 10/06/13 Potential to Achieve Goals: Good    Frequency Min 3X/week   Barriers to discharge        Co-evaluation               End of Session Equipment Utilized During Treatment: Gait belt Activity Tolerance: Patient tolerated treatment well Patient left: in chair;with call  bell/phone within reach;with nursing/sitter in room Nurse Communication: Mobility status         Time: 6270-3500 PT Time Calculation (min): 17 min   Charges:   PT Evaluation $Initial PT Evaluation Tier I: 1 Procedure PT Treatments $Therapeutic Activity: 8-22 mins   PT G Codes:          Neil Crouch 09/29/2013, 11:23 AM

## 2013-09-29 NOTE — Progress Notes (Addendum)
Clinical Social Work Department CLINICAL SOCIAL WORK PLACEMENT NOTE 09/29/2013  Patient:  Shelley Adams, Shelley Adams  Account Number:  [id number] Admit date:  09/27/2013  Clinical Social Worker:  Sindy Messing, LCSW  Date/time:  09/29/2013 11:00 AM  Clinical Social Work is seeking post-discharge placement for this patient at the following level of care:   [facility]   (*CSW will update this form in Epic as items are completed)   09/29/2013  Patient/family provided with [place] Department of Clinical Social Work's list of facilities offering this level of care within the geographic area requested by the patient (or if unable, by the patient's family).  09/29/2013  Patient/family informed of their freedom to choose among providers that offer the needed level of care, that participate in Medicare, Medicaid or managed care program needed by the patient, have an available bed and are willing to accept the patient.  09/29/2013  Patient/family informed of [facility]' ownership interest in [facility], as well as of the fact that they are under no obligation to receive care at this facility.  PASARR submitted to EDS on 09/29/2013 PASARR number received from EDS on 09/29/2013  FL2 transmitted to all facilities in geographic area requested by pt/family on  09/29/2013 FL2 transmitted to all facilities within larger geographic area on   Patient informed that his/her managed care company has contracts with or will negotiate with  certain facilities, including the following:     Patient/family informed of bed offers received:  09/29/13 Patient chooses bed at  Physician recommends and patient chooses bed at    Patient to be transferred to  on   Patient to be transferred to facility by   The following physician request were entered in Epic:   Additional Comments:

## 2013-09-29 NOTE — Progress Notes (Signed)
   Subjective:  Patient reports pain as none.    Objective:   VITALS:   Filed Vitals:   09/28/13 1934 09/28/13 2150 09/29/13 0149 09/29/13 0617  BP:  95/60 94/60 102/63  Pulse:  81 89 86  Temp:  97.6 F (36.4 C) 98.1 F (36.7 C) 98.5 F (36.9 C)  TempSrc:  Oral Oral Oral  Resp: 18 18 14 16   Height:      Weight:      SpO2: 95% 94% 94% 96%    Neurovascular intact Sensation intact distally Intact pulses distally Dorsiflexion/Plantar flexion intact Incision: dressing C/D/I and no drainage No cellulitis present Compartment soft   Lab Results  Component Value Date   WBC 6.6 09/29/2013   HGB 9.1* 09/29/2013   HCT 27.8* 09/29/2013   MCV 87.1 09/29/2013   PLT 188 09/29/2013     Assessment/Plan:  1 Day Post-Op   - Expected postop acute blood loss anemia - will monitor for symptoms - Up with PT/OT - DVT ppx - SCDs, ambulation, lovenox - WBAT right lower extremity, post hip precautions - Pain control - well controlled - continue foley due to low UOP  Problem List Items Addressed This Visit     Musculoskeletal and Integument   Closed right hip fracture - Primary   Relevant Orders      Weight bearing as tolerated   Hip fracture   Relevant Orders      Weight bearing as tolerated    Other Visit Diagnoses   Fall            Marianna Payment 09/29/2013, 7:26 AM [phone]

## 2013-09-29 NOTE — Evaluation (Signed)
Occupational Therapy Evaluation Patient Details Name: Shelley Adams MRN: [id number] DOB: 05-11-1923 Today's Date: 09/29/2013    History of Present Illness 80 yo female s/p right hip hemiarthroplasty 09/26/13 following mechanical fall resulting in hip fx prior to adm; PMHx: HTN, dementia   Clinical Impression   Pt presents to OT with decreased I with ADL activity due to problems listed below. Pt will benefit from skilled OT to increase I with ADL activity and return to PLOF    Follow Up Recommendations  SNF    Equipment Recommendations  None recommended by OT    Recommendations for Other Services PT consult     Precautions / Restrictions Precautions Precautions: Posterior Hip Restrictions RLE Weight Bearing: Weight bearing as tolerated      Mobility Bed Mobility Overal bed mobility: Needs Assistance Bed Mobility: Supine to Sit     Supine to sit: Max assist;HOB elevated     General bed mobility comments: to EOB with OT  Transfers Overall transfer level: Needs assistance Equipment used: Rolling walker (2 wheeled) Transfers: Sit to/from [employer] Sit to Stand: +2 physical assistance;Total assist   Squat pivot transfers: Total assist (3/4 stand pivot)     General transfer comment: pt unable to come to full stand, significant posterior bias when standing attempted with pt extending LEs in front of her    Balance Overall balance assessment: Needs assistance;History of Falls           Standing balance-Leahy Scale: Zero                              ADL Overall ADL's : Needs assistance/impaired     Grooming: Moderate assistance;Sitting           Upper Body Dressing : Moderate assistance;Sitting   Lower Body Dressing: Sitting/lateral leans;Cueing for safety;Adhering to hip precautions;Maximal assistance                 General ADL Comments: Pt did need significant A at home with ADL activity but was able to stand  with walker and sit in shower.       Vision                             Extremity/Trunk Assessment Upper Extremity Assessment Upper Extremity Assessment: Generalized weakness   Lower Extremity Assessment Lower Extremity Assessment: RLE deficits/detail;Generalized weakness RLE: Unable to fully assess due to pain       Communication Communication Communication: No difficulties   Cognition Arousal/Alertness: Awake/alert   Overall Cognitive Status: History of cognitive impairments - at baseline       Memory: Decreased recall of precautions                        Home Living Family/patient expects to be discharged to:: Private residence Living Arrangements: Non-relatives/Friends;Other (Comment) Available Help at Discharge: Personal care attendant;Available 24 hours/day Type of Home: House Home Access: Stairs to enter [employer] of Steps: 1   Home Layout: Two level;Able to live on main level with bedroom/bathroom     Bathroom Shower/Tub: Occupational psychologist: Standard     Home Equipment: Walker - 2 wheels;Grab bars - tub/shower;Hand held shower head;Shower seat          Prior Functioning/Environment Level of Independence: Needs assistance  Gait / Transfers Assistance Needed: short distance household amb  at baseline, uses RW and caregivers provide min/guard to supervision, more assist need for sit to stand transfers          OT Diagnosis: Generalized weakness;Acute pain   OT Problem List: Decreased activity tolerance   OT Treatment/Interventions: Self-care/ADL training;DME and/or AE instruction;Patient/family education    OT Goals(Current goals can be found in the care plan section) Acute Rehab OT Goals Patient Stated Goal: walk again OT Goal Formulation: With patient Time For Goal Achievement: 10/13/13 Potential to Achieve Goals: Good ADL Goals Pt Will Perform Grooming: with set-up;sitting Pt Will Transfer to  Toilet: with min guard assist;bedside commode Pt Will Perform Toileting - Clothing Manipulation and hygiene: with min guard assist;sit to/from stand  OT Frequency: Min 2X/week   Barriers to D/C:               End of Session Nurse Communication: Mobility status  Activity Tolerance: Patient tolerated treatment well Patient left: Other (comment);with family/visitor present (sitting EOB with PT)   Time: 8882-8003 OT Time Calculation (min): 17 min Charges:  OT General Charges $OT Visit: 1 Procedure OT Evaluation $Initial OT Evaluation Tier I: 1 Procedure OT Treatments $Self Care/Home Management : 8-22 mins G-Codes:    Betsy Pries 10-22-13, 11:33 AM

## 2013-09-29 NOTE — Progress Notes (Signed)
Clinical Social Work Department BRIEF PSYCHOSOCIAL ASSESSMENT 09/29/2013  Patient:  CECILEY, BUIST     Account Number:  [id number]     Admit date:  09/27/2013  Clinical Social Worker:  Earlie Server  Date/Time:  09/29/2013 11:00 AM  Referred by:  Physician  Date Referred:  09/29/2013 Referred for  SNF Placement   Other Referral:   Interview type:  Family Other interview type:   Patient sleeping and did not wake for assessment.    PSYCHOSOCIAL DATA Living Status:  FAMILY Admitted from facility:   Level of care:   Primary support name:  Butch Penny Primary support relationship to patient:  CHILD, ADULT Degree of support available:   Strong    CURRENT CONCERNS Current Concerns  Post-Acute Placement   Other Concerns:    SOCIAL WORK ASSESSMENT / PLAN CSW received a referral to assist with DC planning. CSW reviewed chart and met with patient, caregiver, and dtr at bedside. CSW introduced myself and explained role.    Patient has dementia and slept during assessment. Dtr has hired caregivers and reports they recently started staying 24 hours. Dtr is concerned about patient returning home prior to getting therapy at SNF. CSW explained SNF process including Medicare coverage for SNF. Dtr agreeable to [facility] search but only for [employer]. CSW encouraged dtr to tour or research reviews for SNFs at [email or web address].    CSW completed FL2 and faxed out. CSW will follow up with bed offers.   Assessment/plan status:  Psychosocial Support/Ongoing Assessment of Needs Other assessment/ plan:   Information/referral to community resources:   SNF information    PATIENT'S/FAMILY'S RESPONSE TO PLAN OF CARE: Patient unable to fully participate in assessment. Patient's dtr very engaged and wants patient to DC to SNF. Dtr is not interested in LT stay and wanted to ensure that patient could leave SNF after ST stay for rehab. Dtr will call friends and family in order to choose best SNF  for patient. Dtr engaged and asked appropriate questions but wants to ensure patient gets the best care.       Sindy Messing, LCSW (Coverage for [employer])

## 2013-09-29 NOTE — Progress Notes (Signed)
PROGRESS NOTE  Shelley Adams QMV:[id number] DOB: [birth year] DOA: 09/27/2013 PCP: Delrae Rend, NP  HPI: Shelley Adams is a 80 y.o. female has a past medical history significant for HTN, advanced dementia, malnutrition, comes in after suffering a mechanical fall, came in with hip fx.   Assessment/Plan: Hip fracture - orthopedic surgery consulted, s/p hip[ arthroplasty 5/10 am  HTN - will hold BP meds, BP normal post op, monitor.   Severe protein calorie malnutrition   Advanced dementia - patient progressively worse in the past months few years, poor po intake, significant weight loss and cachectic. Daughter asking about hospice, it may be reasonable to consider.   Recurrent UTIs - urinalysis in the ED unremarkable, will not continue Bactrim.   ABLA - expected, monitor HH.   Diet: advance as tolerated, to regular Fluids: NS DVT Prophylaxis: per ortho  Code Status: DNR Family Communication: daughter bedside  Disposition Plan: inpatient  Consultants:  Orthopedic surgery   Procedures:  Hip surgery    Antibiotics  Anti-infectives   Start     Dose/Rate Route Frequency Ordered Stop   09/28/13 1200  ceFAZolin (ANCEF) IVPB 2 g/50 mL premix     2 g 100 mL/hr over 30 Minutes Intravenous Every 6 hours 09/28/13 1050 09/29/13 0050   09/28/13 0600  ceFAZolin (ANCEF) IVPB 2 g/50 mL premix     2 g 100 mL/hr over 30 Minutes Intravenous On call to O.R. 09/27/13 1559 09/28/13 0737     Antibiotics Given (last 72 hours)   Date/Time Action Medication Dose Rate   09/28/13 0737 Given   ceFAZolin (ANCEF) IVPB 2 g/50 mL premix 2 g    09/28/13 1134 Given   ceFAZolin (ANCEF) IVPB 2 g/50 mL premix 2 g 100 mL/hr   09/28/13 1731 Given   ceFAZolin (ANCEF) IVPB 2 g/50 mL premix 2 g 100 mL/hr   09/29/13 0020 Given   ceFAZolin (ANCEF) IVPB 2 g/50 mL premix 2 g 100 mL/hr      HPI/Subjective: - she has no complaints this morning, no recollection of her fall or having  surgery  Objective: Filed Vitals:   09/29/13 0149 09/29/13 0617 09/29/13 0949 09/29/13 1400  BP: 94/60 102/63 102/63 97/65  Pulse: 89 86 77 87  Temp: 98.1 F (36.7 C) 98.5 F (36.9 C) 98 F (36.7 C) 98.6 F (37 C)  TempSrc: Oral Oral Axillary Oral  Resp: 14 16 16 16   Height:      Weight:      SpO2: 94% 96% 95% 92%    Intake/Output Summary (Last 24 hours) at 09/29/13 1437 Last data filed at 09/29/13 1400  Gross per 24 hour  Intake 1777.5 ml  Output    875 ml  Net  902.5 ml   Filed Weights   09/27/13 1530  Weight: 40.824 kg (90 lb)   Exam:  General:  NAD  Cardiovascular: regular rate and rhythm, without MRG  Respiratory: good air movement, clear to auscultation throughout, no wheezing, ronchi or rales  Abdomen: soft, not tender to palpation, positive bowel sounds  MSK: no peripheral edema  Neuro: non focal  Data Reviewed: Basic Metabolic Panel:  Recent Labs Lab 09/27/13 1245 09/28/13 0512 09/29/13 0405  NA 140 141 139  K 4.1 3.8 3.7  CL 101 105 109  CO2 28 25 22   GLUCOSE 102* 92 116*  BUN 33* 25* 18  CREATININE 0.95 0.70 0.64  CALCIUM 9.2 8.5 7.7*   CBC:  Recent Labs Lab 09/27/13  1245 09/28/13 0512 09/29/13 0405  WBC 9.9 7.9 6.6  NEUTROABS 8.2*  --   --   HGB 13.5 12.0 9.1*  HCT 40.4 36.5 27.8*  MCV 85.2 85.9 87.1  PLT 227 192 188    Recent Results (from the past 240 hour(s))  URINE CULTURE     Status: None   Collection Time    09/27/13  1:47 PM      Result Value Ref Range Status   Specimen Description URINE, CATHETERIZED   Final   Special Requests NONE   Final   Culture  Setup Time     Final   Value: 09/27/2013 18:14     Performed at [place]     Final   Value: NO GROWTH     Performed at [facility]   Culture     Final   Value: NO GROWTH     Performed at [facility]   Report Status 09/29/2013 FINAL   Final  SURGICAL PCR SCREEN     Status: None   Collection Time    09/27/13  7:39  PM      Result Value Ref Range Status   MRSA, PCR NEGATIVE  NEGATIVE Final   Staphylococcus aureus NEGATIVE  NEGATIVE Final   Comment:            The Xpert SA Assay (FDA     approved for NASAL specimens     in patients over 60 years of age),     is one component of     a comprehensive surveillance     program.  Test performance has     been validated by [employer] for patients greater     than or equal to 50 year old.     It is not intended     to diagnose infection nor to     guide or monitor treatment.     Studies: Dg Pelvis Portable  09/28/2013   CLINICAL DATA:  Postop right hip replacement  EXAM: PORTABLE PELVIS 1-2 VIEWS  COMPARISON:  None.  FINDINGS: Unipolar right hip replacement identified in anticipated position. Pelvic bones and proximal right femur within normal limits. Postsurgical change over the right gluteal region and lower extremity proximally.  IMPRESSION: Anticipated postoperative appearance.   Electronically Signed   By: Skipper Cliche M.D.   On: 09/28/2013 10:30    Scheduled Meds: . donepezil  10 mg Oral q morning - 10a  . enoxaparin (LOVENOX) injection  40 mg Subcutaneous Q24H  . feeding supplement (ENSURE COMPLETE)  237 mL Oral TID BM  . latanoprost  1 drop Right Eye QHS  . senna  1 tablet Oral BID   Continuous Infusions: . sodium chloride 125 mL/hr (09/29/13 0903)  . dextrose 5 % and 0.45% NaCl 75 mL/hr at 09/27/13 1630    Active Problems:   Closed right hip fracture   Hip fracture   Advanced dementia   Protein calorie malnutrition   Hypertension  Time spent: 25  This note has been created with Surveyor, quantity. Any transcriptional errors are unintentional.   Marzetta Board, MD [facility] Pager [phone]. If 7 PM - 7 AM, please contact night-coverage at [email or web address], password [facility] 09/29/2013, 2:37 PM  LOS: 2 days

## 2013-09-30 LAB — BASIC METABOLIC PANEL
BUN: 12 mg/dL (ref 6–23)
CO2: 22 mEq/L (ref 19–32)
Calcium: 7.6 mg/dL — ABNORMAL LOW (ref 8.4–10.5)
Chloride: 113 mEq/L — ABNORMAL HIGH (ref 96–112)
Creatinine, Ser: 0.56 mg/dL (ref 0.50–1.10)
GFR, EST NON AFRICAN AMERICAN: 80 mL/min — AB (ref >90)
Glucose, Bld: 84 mg/dL (ref 70–99)
POTASSIUM: 3.1 meq/L — AB (ref 3.7–5.3)
Sodium: 144 mEq/L (ref 137–147)

## 2013-09-30 LAB — CBC
HEMATOCRIT: 23.5 % — AB (ref 36.0–46.0)
Hemoglobin: 7.8 g/dL — ABNORMAL LOW (ref 12.0–15.0)
MCH: 28.7 pg (ref 26.0–34.0)
MCHC: 33.2 g/dL (ref 30.0–36.0)
MCV: 86.4 fL (ref 78.0–100.0)
PLATELETS: 154 10*3/uL (ref 150–400)
RBC: 2.72 MIL/uL — ABNORMAL LOW (ref 3.87–5.11)
RDW: 14.6 % (ref 11.5–15.5)
WBC: 5.2 10*3/uL (ref 4.0–10.5)

## 2013-09-30 LAB — PREPARE RBC (CROSSMATCH)

## 2013-09-30 MED ORDER — POTASSIUM CHLORIDE 20 MEQ/15ML (10%) PO LIQD
40.0000 meq | Freq: Two times a day (BID) | ORAL | Status: AC
  Administered 2013-09-30 (×2): 40 meq via ORAL
  Filled 2013-09-30 (×2): qty 30

## 2013-09-30 MED ORDER — MIRTAZAPINE 15 MG PO TABS
15.0000 mg | ORAL_TABLET | Freq: Every day | ORAL | Status: DC
  Administered 2013-09-30: 15 mg via ORAL
  Filled 2013-09-30 (×2): qty 1

## 2013-09-30 NOTE — Progress Notes (Signed)
PROGRESS NOTE  Shelley Adams:[id number] DOB: 1922/06/01 DOA: 09/27/2013 PCP: Delrae Rend, NP  HPI: Shelley Adams is a 80 y.o. female has a past medical history significant for HTN, advanced dementia, malnutrition, comes in after suffering a mechanical fall, came in with hip fx.   Assessment/Plan: Hip fracture - orthopedic surgery consulted, s/p hip[ arthroplasty 5/10 am  HTN - will hold BP meds, BP normal post op, monitor.   Severe protein calorie malnutrition   Advanced dementia - patient progressively worse in the past months few years, poor po intake, significant weight loss and cachectic. Daughter asking about hospice, it may be reasonable to consider.   Recurrent UTIs - urinalysis in the ED unremarkable, will not continue Bactrim.   ABLA - expected,  - transfuse 1U today given rate of decrease   Diet: advance as tolerated, to regular Fluids: NS DVT Prophylaxis: per ortho  Code Status: DNR Family Communication: daughter bedside  Disposition Plan: inpatient  Consultants:  Orthopedic surgery   Procedures:  Hip surgery    Antibiotics  Anti-infectives   Start     Dose/Rate Route Frequency Ordered Stop   09/28/13 1200  ceFAZolin (ANCEF) IVPB 2 g/50 mL premix     2 g 100 mL/hr over 30 Minutes Intravenous Every 6 hours 09/28/13 1050 09/29/13 0050   09/28/13 0600  ceFAZolin (ANCEF) IVPB 2 g/50 mL premix     2 g 100 mL/hr over 30 Minutes Intravenous On call to O.R. 09/27/13 1559 09/28/13 0737     Antibiotics Given (last 72 hours)   Date/Time Action Medication Dose Rate   09/28/13 0737 Given   ceFAZolin (ANCEF) IVPB 2 g/50 mL premix 2 g    09/28/13 1134 Given   ceFAZolin (ANCEF) IVPB 2 g/50 mL premix 2 g 100 mL/hr   09/28/13 1731 Given   ceFAZolin (ANCEF) IVPB 2 g/50 mL premix 2 g 100 mL/hr   09/29/13 0020 Given   ceFAZolin (ANCEF) IVPB 2 g/50 mL premix 2 g 100 mL/hr     HPI/Subjective: - she has no complaints this  morning  Objective: Filed Vitals:   09/29/13 2314 09/30/13 0210 09/30/13 0400 09/30/13 0545  BP:  102/63  112/65  Pulse:  85  79  Temp: 99.1 F (37.3 C) 98.9 F (37.2 C)  98.7 F (37.1 C)  TempSrc: Oral Oral  Oral  Resp:  17 16 16   Height:      Weight:      SpO2:  93%  95%    Intake/Output Summary (Last 24 hours) at 09/30/13 0944 Last data filed at 09/30/13 0704  Gross per 24 hour  Intake 3077.5 ml  Output   1251 ml  Net 1826.5 ml   Filed Weights   09/27/13 1530  Weight: 40.824 kg (90 lb)   Exam:  General:  NAD, pale appearing  Cardiovascular: regular rate and rhythm, without MRG  Respiratory: good air movement, clear to auscultation throughout, no wheezing, ronchi or rales  Abdomen: soft, not tender to palpation, positive bowel sounds  MSK: no peripheral edema  Neuro: non focal  Data Reviewed: Basic Metabolic Panel:  Recent Labs Lab 09/27/13 1245 09/28/13 0512 09/29/13 0405 09/30/13 0442  NA 140 141 139 144  K 4.1 3.8 3.7 3.1*  CL 101 105 109 113*  CO2 28 25 22 22   GLUCOSE 102* 92 116* 84  BUN 33* 25* 18 12  CREATININE 0.95 0.70 0.64 0.56  CALCIUM 9.2 8.5 7.7* 7.6*   CBC:  Recent Labs Lab 09/27/13 1245 09/28/13 0512 09/29/13 0405 09/30/13 0442  WBC 9.9 7.9 6.6 5.2  NEUTROABS 8.2*  --   --   --   HGB 13.5 12.0 9.1* 7.8*  HCT 40.4 36.5 27.8* 23.5*  MCV 85.2 85.9 87.1 86.4  PLT 227 192 188 154    Recent Results (from the past 240 hour(s))  URINE CULTURE     Status: None   Collection Time    09/27/13  1:47 PM      Result Value Ref Range Status   Specimen Description URINE, CATHETERIZED   Final   Special Requests NONE   Final   Culture  Setup Time     Final   Value: 09/27/2013 18:14     Performed at [place]     Final   Value: NO GROWTH     Performed at [facility]   Culture     Final   Value: NO GROWTH     Performed at [facility]   Report Status 09/29/2013 FINAL   Final  SURGICAL  PCR SCREEN     Status: None   Collection Time    09/27/13  7:39 PM      Result Value Ref Range Status   MRSA, PCR NEGATIVE  NEGATIVE Final   Staphylococcus aureus NEGATIVE  NEGATIVE Final   Comment:            The Xpert SA Assay (FDA     approved for NASAL specimens     in patients over 43 years of age),     is one component of     a comprehensive surveillance     program.  Test performance has     been validated by [employer] for patients greater     than or equal to 85 year old.     It is not intended     to diagnose infection nor to     guide or monitor treatment.     Studies: Dg Pelvis Portable  09/28/2013   CLINICAL DATA:  Postop right hip replacement  EXAM: PORTABLE PELVIS 1-2 VIEWS  COMPARISON:  None.  FINDINGS: Unipolar right hip replacement identified in anticipated position. Pelvic bones and proximal right femur within normal limits. Postsurgical change over the right gluteal region and lower extremity proximally.  IMPRESSION: Anticipated postoperative appearance.   Electronically Signed   By: Skipper Cliche M.D.   On: 09/28/2013 10:30    Scheduled Meds: . donepezil  10 mg Oral q morning - 10a  . enoxaparin (LOVENOX) injection  40 mg Subcutaneous Q24H  . feeding supplement (ENSURE COMPLETE)  237 mL Oral TID BM  . latanoprost  1 drop Right Eye QHS  . mirtazapine  15 mg Oral QHS  . potassium chloride  40 mEq Oral BID  . senna  1 tablet Oral BID   Continuous Infusions: . sodium chloride 125 mL/hr (09/29/13 0903)  . dextrose 5 % and 0.45% NaCl 75 mL/hr at 09/27/13 1630    Active Problems:   Closed right hip fracture   Hip fracture   Advanced dementia   Protein calorie malnutrition   Hypertension  Time spent: 25  This note has been created with Surveyor, quantity. Any transcriptional errors are unintentional.   Marzetta Board, MD [facility] Pager [phone]. If 7 PM - 7 AM, please contact night-coverage  at [email or web address], password [facility]  09/30/2013, 9:44 AM  LOS: 3 days

## 2013-09-30 NOTE — Progress Notes (Signed)
CSW assisting with d/c planning. Pt / family have chosen [facility] for [employer]. SNF will have a bed available for pt when she is ready for d/c. CSW will continue to follow to assist with d/c planning.  Werner Lean LCSW [phone]

## 2013-09-30 NOTE — Progress Notes (Signed)
09/30/13 1100  PT Visit Information  Last PT Received On 09/30/13  Reason Eval/Treat Not Completed Other (comment) Pt getting blood, very sleepy, resting now and had a restless night last night per pt family; will defer at this time and check back as schedule permits;

## 2013-09-30 NOTE — Progress Notes (Signed)
   Subjective:  Patient reports pain as none when still but can be severe when moved.   Objective:   VITALS:   Filed Vitals:   09/29/13 2314 09/30/13 0210 09/30/13 0400 09/30/13 0545  BP:  102/63  112/65  Pulse:  85  79  Temp: 99.1 F (37.3 C) 98.9 F (37.2 C)  98.7 F (37.1 C)  TempSrc: Oral Oral  Oral  Resp:  17 16 16   Height:      Weight:      SpO2:  93%  95%    Dressing c/d/i NVI distally Foot wwp   Lab Results  Component Value Date   WBC 5.2 09/30/2013   HGB 7.8* 09/30/2013   HCT 23.5* 09/30/2013   MCV 86.4 09/30/2013   PLT 154 09/30/2013     Assessment/Plan:  2 Days Post-Op   - Expected postop acute blood loss anemia - will monitor for symptoms - Up with PT/OT - recommends SNF - DVT ppx - SCDs, ambulation, lovenox - WBAT right lower extremity, posterior hip precautions - Pain control - Discharge planning per primary team   Problem List Items Addressed This Visit     Musculoskeletal and Integument   Closed right hip fracture - Primary   Relevant Orders      Weight bearing as tolerated   Hip fracture   Relevant Orders      Weight bearing as tolerated    Other Visit Diagnoses   Fall            Marianna Payment 09/30/2013, 8:13 AM [phone]

## 2013-10-01 DIAGNOSIS — E43 Unspecified severe protein-calorie malnutrition: Secondary | ICD-10-CM | POA: Diagnosis present

## 2013-10-01 LAB — BASIC METABOLIC PANEL
BUN: 9 mg/dL (ref 6–23)
CALCIUM: 7.9 mg/dL — AB (ref 8.4–10.5)
CO2: 22 mEq/L (ref 19–32)
Chloride: 111 mEq/L (ref 96–112)
Creatinine, Ser: 0.51 mg/dL (ref 0.50–1.10)
GFR, EST NON AFRICAN AMERICAN: 83 mL/min — AB (ref >90)
Glucose, Bld: 88 mg/dL (ref 70–99)
POTASSIUM: 3.5 meq/L — AB (ref 3.7–5.3)
SODIUM: 142 meq/L (ref 137–147)

## 2013-10-01 LAB — TYPE AND SCREEN
ABO/RH(D): O POS
Antibody Screen: NEGATIVE
UNIT DIVISION: 0

## 2013-10-01 LAB — CBC
HCT: 30.8 % — ABNORMAL LOW (ref 36.0–46.0)
Hemoglobin: 10.3 g/dL — ABNORMAL LOW (ref 12.0–15.0)
MCH: 28.3 pg (ref 26.0–34.0)
MCHC: 33.4 g/dL (ref 30.0–36.0)
MCV: 84.6 fL (ref 78.0–100.0)
PLATELETS: 207 10*3/uL (ref 150–400)
RBC: 3.64 MIL/uL — ABNORMAL LOW (ref 3.87–5.11)
RDW: 14.1 % (ref 11.5–15.5)
WBC: 7 10*3/uL (ref 4.0–10.5)

## 2013-10-01 MED ORDER — ENOXAPARIN SODIUM 30 MG/0.3ML ~~LOC~~ SOLN: 30.0000 mg | SUBCUTANEOUS | Status: DC

## 2013-10-01 MED ORDER — ENOXAPARIN SODIUM 30 MG/0.3ML ~~LOC~~ SOLN
30.0000 mg | SUBCUTANEOUS | Status: DC
  Filled 2013-10-01: qty 0.3

## 2013-10-01 NOTE — Discharge Instructions (Signed)
1. Remove surgical dressing on 10/05/13 and then place dry dressing on incision and change daily 2. May get incision wet after 10 days after surgery 3. Posterior hip precautions 4. Take medicines as prescribed

## 2013-10-01 NOTE — Progress Notes (Signed)
Clinical Social Work Department CLINICAL SOCIAL WORK PLACEMENT NOTE 10/01/2013  Patient:  Shelley Adams, Shelley Adams  Account Number:  [id number] Admit date:  09/27/2013  Clinical Social Worker:  Sindy Messing, LCSW  Date/time:  09/29/2013 11:00 AM  Clinical Social Work is seeking post-discharge placement for this patient at the following level of care:   [place]   (*CSW will update this form in Epic as items are completed)   09/29/2013  Patient/family provided with [place] Department of Clinical Social Work's list of facilities offering this level of care within the geographic area requested by the patient (or if unable, by the patient's family).  09/29/2013  Patient/family informed of their freedom to choose among providers that offer the needed level of care, that participate in Medicare, Medicaid or managed care program needed by the patient, have an available bed and are willing to accept the patient.  09/29/2013  Patient/family informed of [facility]' ownership interest in [facility], as well as of the fact that they are under no obligation to receive care at this facility.  PASARR submitted to EDS on 09/29/2013 PASARR number received from EDS on 09/29/2013  FL2 transmitted to all facilities in geographic area requested by pt/family on  09/29/2013 FL2 transmitted to all facilities within larger geographic area on   Patient informed that his/her managed care company has contracts with or will negotiate with  certain facilities, including the following:     Patient/family informed of bed offers received:  09/30/2013 Patient chooses bed at [facility]ician recommends and patient chooses bed at    Patient to be transferred to [place] on  10/01/2013 Patient to be transferred to facility by [facility]  The following physician request were entered in Epic:   Additional Comments:  Werner Lean LCSW [phone]

## 2013-10-01 NOTE — Progress Notes (Addendum)
Patient assessment has not changed from am. Patient is stable at discharge. Report given to Diplomatic Services operational officer at [facility]. Family is at beside.

## 2013-10-01 NOTE — Discharge Summary (Signed)
Physician Discharge Summary  [name] JSH:[id number] DOB: 11/16/1922 DOA: 09/27/2013  PCP: Delrae Rend, NP  Admit date: 09/27/2013 Discharge date: 10/01/2013  Time spent: > 35 minutes  Recommendations for Outpatient Follow-up:  1. Follow up with Dr. Erlinda Hong in 1-2 weeks 2. Follow up with PCP in 1-2 weeks  3. Recommend rechecking BMP and CBC in 2-3 days  Discharge Diagnoses:  Active Problems:   Closed right hip fracture   Hip fracture   Advanced dementia   Hypertension   Severe protein-calorie malnutrition  Discharge Condition: stable  Diet recommendation: regular  Filed Weights   09/27/13 1530  Weight: 40.824 kg (90 lb)   History of present illness:  Shelley Adams is a 80 y.o. female has a past medical history significant for HTN, advanced dementia, malnutrition, comes in after suffering a mechanical fall last night and endorsing right hip pain this morning. Daughter is in the room and is able to provide most of the story. They have an 24 hour aide who lives with the patient and last night patient was trying to reach her walker however fell on the floor. At baseline, patient is minimally ambulatory and lays in bed most of the day, and she often will try to get up from her bed to go to the bathroom by herself without telling anyone. As far as the daughter knows, patient did not complain of chest pain, breathing difficulties, fever/chills, no reported abdominal pain, nausea vomiting or diarrhea and no syncopal episodes. She has a history of recurrent UTIs and is currently on Bactrim which she was supposed to stop few days ago but she is still taking it. Patient is alert in the ED, pleasantly demented and oriented to self only. She cannot contribute to the story. In the ED, labs show mild AKI, and a right hip film is positive for acute mildly impacted transcervical femoral neck fracture on the right. Orthopedic surgery (Dr. Lynann Bologna with [facility]) has been consulted  by EDP and [facility] asked to admit.   Hospital Course:  Hip fracture - orthopedic surgery consulted, s/p hip arthroplasty 5/10 am. Following the surgery, patient recovering well, able to transfer to [facility], PT evaluated patient and recommended SNF.  HTN - resume home medications  Severe protein calorie malnutrition - nutrition consult Advanced dementia - patient progressively worse in the past months few years, poor po intake, significant weight loss and cachectic.  Recurrent UTIs - urinalysis in the ED unremarkable, will not continue Bactrim.  ABLA - expected, transfused 1U of pRBC with good improvement in her Hemoglobin 7.8 >> 10.3, no evidence of ongoing bleeding.   Procedures:  R hip arthroplasty   Consultations:  Orthopedic surgery   Discharge Exam: Filed Vitals:   09/30/13 2350 10/01/13 0400 10/01/13 0410 10/01/13 0731  BP:   133/68   Pulse:   89   Temp:   98.3 F (36.8 C)   TempSrc:   Oral   Resp: 14 14 16 20   Height:      Weight:      SpO2:   92% 94%   General: NAD, pleasantly demented Cardiovascular: RRR Respiratory: CTA biL  Discharge Instructions      Discharge Orders   Future Orders Complete By Expires   Weight bearing as tolerated  As directed    Questions:     Laterality:     Extremity:         Medication List    STOP taking these medications  sulfamethoxazole-trimethoprim 800-160 MG per tablet  Commonly known as:  BACTRIM DS     traMADol 50 MG tablet  Commonly known as:  ULTRAM      TAKE these medications       CRANBERRY PLUS VITAMIN C 4200-20-3 MG-MG-UNIT Caps  Generic drug:  Cranberry-Vitamin C-Vitamin E  Take 1 capsule by mouth every morning.     donepezil 10 MG tablet  Commonly known as:  ARICEPT  Take 10 mg by mouth every morning.     enoxaparin 30 MG/0.3ML injection  Commonly known as:  LOVENOX  Inject 0.3 mLs (30 mg total) into the skin daily.  Start taking on:  10/02/2013     HYDROcodone-acetaminophen 5-325 MG per tablet    Commonly known as:  NORCO  Take 1-2 tablets by mouth every 6 (six) hours as needed.     latanoprost 0.005 % ophthalmic solution  Commonly known as:  XALATAN  Place 1 drop into the right eye at bedtime.     mirtazapine 15 MG tablet  Commonly known as:  REMERON  Take 15 mg by mouth at bedtime. For appetite     multivitamin with minerals Tabs tablet  Take 1 tablet by mouth daily with breakfast.     naproxen sodium 220 MG tablet  Commonly known as:  ANAPROX  Take 440 mg by mouth 2 (two) times daily as needed.     saccharomyces boulardii 250 MG capsule  Commonly known as:  FLORASTOR  Take 250 mg by mouth every morning.     triamterene-hydrochlorothiazide 37.5-25 MG per tablet  Commonly known as:  MAXZIDE-25  Take 1 tablet by mouth every morning.       Follow-up Information   Follow up with Marianna Payment, MD In 2 weeks.   Specialty:  Orthopedic Surgery   Contact information:   [place] [phone] [phone]       Follow up with Delrae Rend, NP. Schedule an appointment as soon as possible for a visit in 2 weeks.   Specialty:  Nurse Practitioner   Contact information:   [address] [place] [address] [phone]       The results of significant diagnostics from this hospitalization (including imaging, microbiology, ancillary and laboratory) are listed below for reference.    Significant Diagnostic Studies: Dg Chest 1 View  09/27/2013   CLINICAL DATA:  [place] last night, right hip pain, history of hypertension  EXAM: CHEST - 1 VIEW  COMPARISON:  DG CHEST 2 VIEW dated 11/17/2011  FINDINGS: Heart size and vascular pattern are normal. No consolidation effusion or edema.  IMPRESSION: No active disease.   Electronically Signed   By: Skipper Cliche M.D.   On: 09/27/2013 12:23   Dg Hip Complete Right  09/27/2013   CLINICAL DATA:  Fall, hip pain  EXAM: RIGHT HIP - COMPLETE 2+ VIEW  COMPARISON:  None.  FINDINGS: Acute transcervical  femoral neck fracture on the right. The femoral head remains located in the acetabulum. The visualized bony pelvis is intact. Advanced degenerative change noted in the mid lumbar spine. There is significant left lateral translation of L2 on L3 with associated chronic degenerative changes. Moderate colonic stool burden. The bones are osteopenic.  IMPRESSION: Acute mildly impacted transcervical femoral neck fracture on the right.  The bones are osteopenic.   Electronically Signed   By: Jacqulynn Cadet M.D.   On: 09/27/2013 12:27   Ct Head Wo Contrast  09/27/2013   CLINICAL  DATA:  Dementia, patient fell during the night  EXAM: CT HEAD WITHOUT CONTRAST  TECHNIQUE: Contiguous axial images were obtained from the base of the skull through the vertex without intravenous contrast.  COMPARISON:  CT HEAD W/O CM dated 09/12/2012; CT C SPINE W/O CM dated 09/12/2012  FINDINGS: Significant diffuse atrophy. Low attenuation in the deep white matter. No evidence of vascular territory infarct, mass, hemorrhage, or extra-axial fluid. Calvarium is intact.  IMPRESSION: Significant chronic involutional change.  No acute findings.   Electronically Signed   By: Skipper Cliche M.D.   On: 09/27/2013 12:33   Dg Pelvis Portable  09/28/2013   CLINICAL DATA:  Postop right hip replacement  EXAM: PORTABLE PELVIS 1-2 VIEWS  COMPARISON:  None.  FINDINGS: Unipolar right hip replacement identified in anticipated position. Pelvic bones and proximal right femur within normal limits. Postsurgical change over the right gluteal region and lower extremity proximally.  IMPRESSION: Anticipated postoperative appearance.   Electronically Signed   By: Skipper Cliche M.D.   On: 09/28/2013 10:30    Microbiology: Recent Results (from the past 240 hour(s))  URINE CULTURE     Status: None   Collection Time    09/27/13  1:47 PM      Result Value Ref Range Status   Specimen Description URINE, CATHETERIZED   Final   Special Requests NONE   Final    Culture  Setup Time     Final   Value: 09/27/2013 18:14     Performed at [facility]     Final   Value: NO GROWTH     Performed at [facility]   Culture     Final   Value: NO GROWTH     Performed at [facility]   Report Status 09/29/2013 FINAL   Final  SURGICAL PCR SCREEN     Status: None   Collection Time    09/27/13  7:39 PM      Result Value Ref Range Status   MRSA, PCR NEGATIVE  NEGATIVE Final   Staphylococcus aureus NEGATIVE  NEGATIVE Final   Comment:            The Xpert SA Assay (FDA     approved for NASAL specimens     in patients over 27 years of age),     is one component of     a comprehensive surveillance     program.  Test performance has     been validated by [employer] for patients greater     than or equal to 81 year old.     It is not intended     to diagnose infection nor to     guide or monitor treatment.     Labs: Basic Metabolic Panel:  Recent Labs Lab 09/27/13 1245 09/28/13 0512 09/29/13 0405 09/30/13 0442 10/01/13 0355  NA 140 141 139 144 142  K 4.1 3.8 3.7 3.1* 3.5*  CL 101 105 109 113* 111  CO2 28 25 22 22 22   GLUCOSE 102* 92 116* 84 88  BUN 33* 25* 18 12 9   CREATININE 0.95 0.70 0.64 0.56 0.51  CALCIUM 9.2 8.5 7.7* 7.6* 7.9*   CBC:  Recent Labs Lab 09/27/13 1245 09/28/13 0512 09/29/13 0405 09/30/13 0442 10/01/13 0355  WBC 9.9 7.9 6.6 5.2 7.0  NEUTROABS 8.2*  --   --   --   --   HGB 13.5 12.0 9.1* 7.8*  10.3*  HCT 40.4 36.5 27.8* 23.5* 30.8*  MCV 85.2 85.9 87.1 86.4 84.6  PLT 227 192 188 154 207    Signed:  Costin M Gherghe  [facility] 10/01/2013, 9:51 AM

## 2013-10-02 ENCOUNTER — Encounter: Payer: PRIVATE HEALTH INSURANCE | Admitting: Nurse Practitioner

## 2013-11-05 ENCOUNTER — Other Ambulatory Visit: Payer: Self-pay | Admitting: *Deleted

## 2014-01-20 DEATH — deceased

## 2014-09-04 IMAGING — CR DG CHEST 1V
1 series · 1 of 1 positions shown · non-contrast
Comparison: DG CHEST 2 VIEW dated 11/17/2011

CLINICAL DATA: Fell last night, right hip pain, history of
hypertension

EXAM:
CHEST - 1 VIEW

[t chest supine]
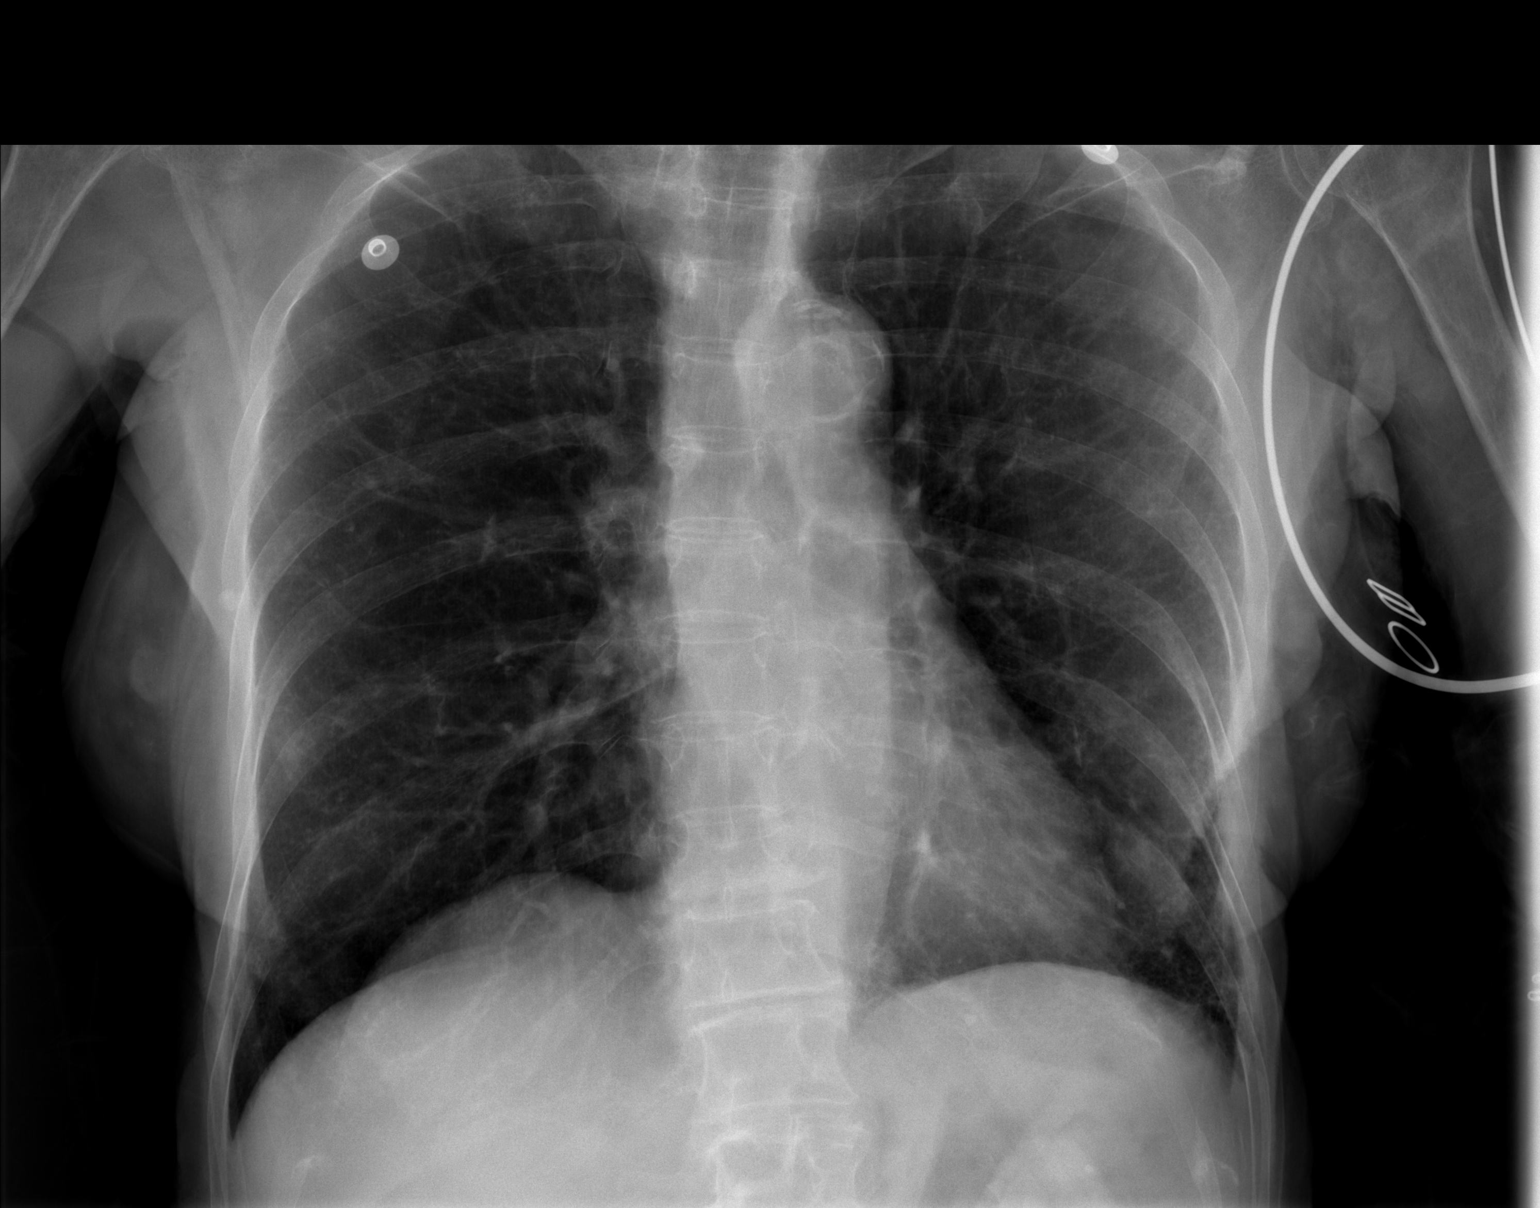

[1 of 1 positions shown; findings below may reference images not displayed]

FINDINGS: Heart size and vascular pattern are normal. No consolidation
effusion or edema.
IMPRESSION: No active disease.

## 2014-09-04 IMAGING — CR DG HIP COMPLETE 2+V*R*
3 series · 3 of 3 positions shown · non-contrast
Comparison: None.

CLINICAL DATA: Fall, hip pain

EXAM:
RIGHT HIP - COMPLETE 2+ VIEW

[t pelvis ap]
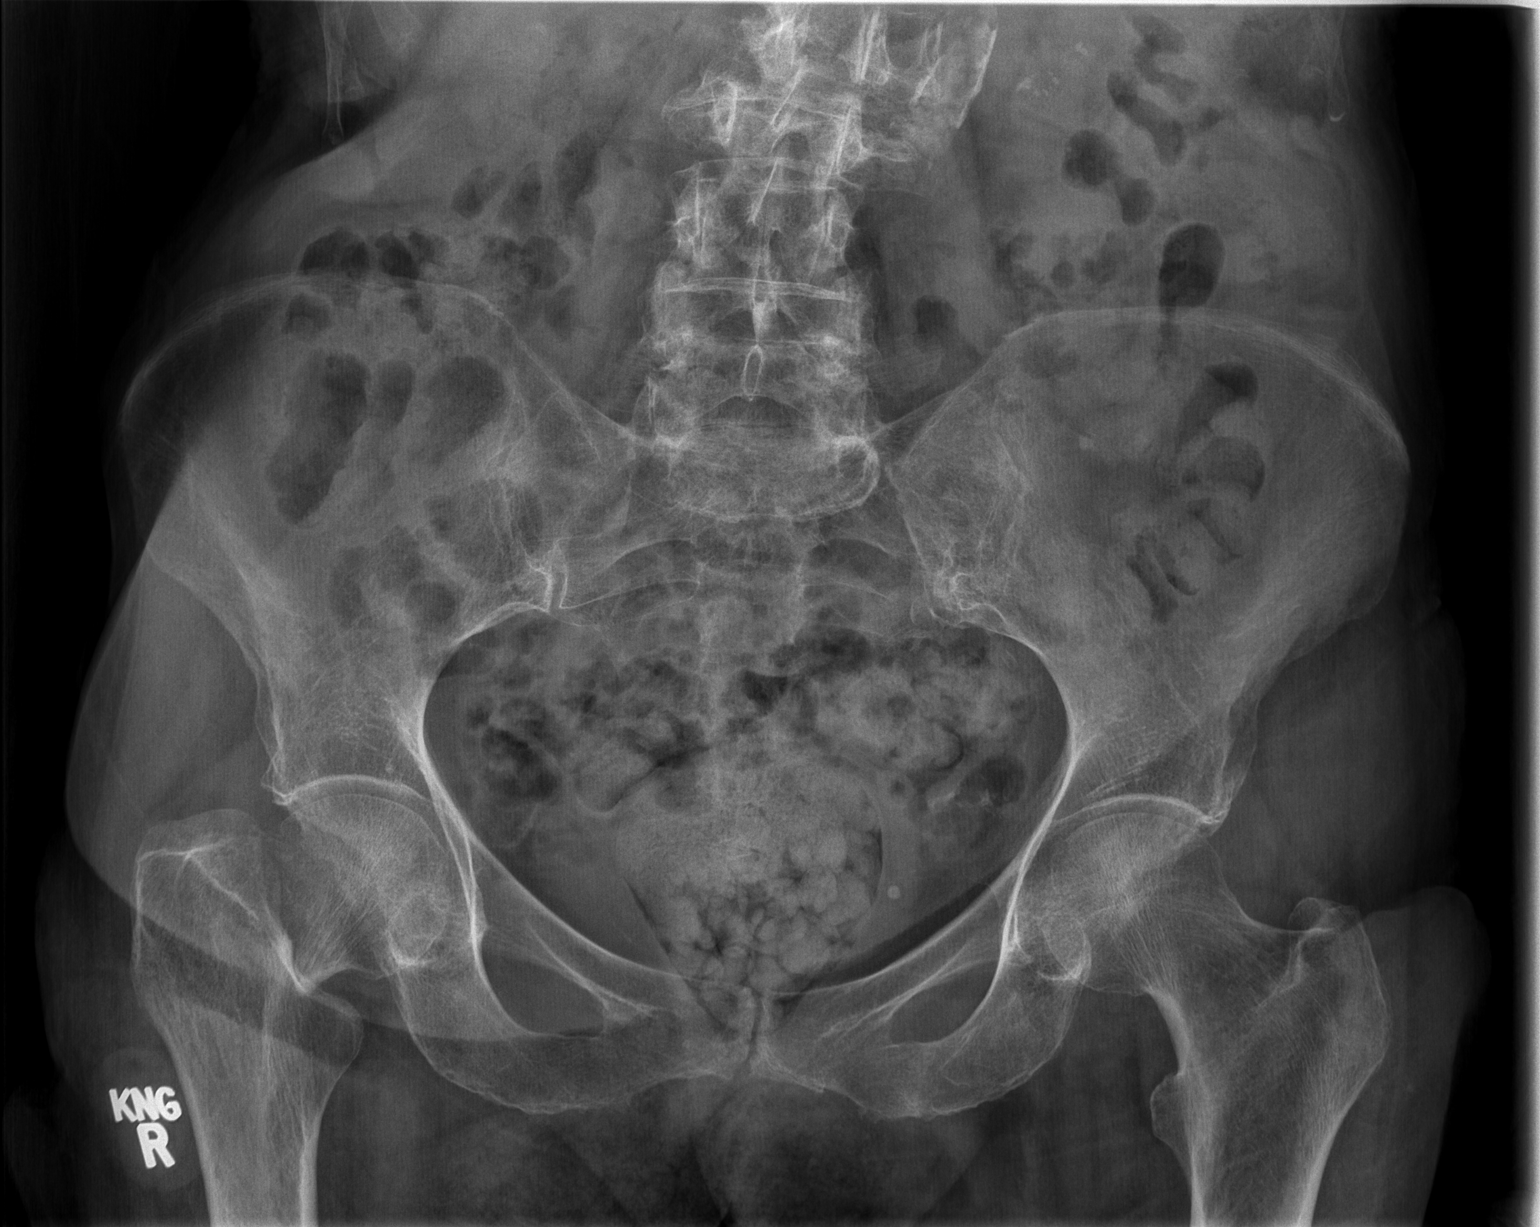

[t hip ap right]
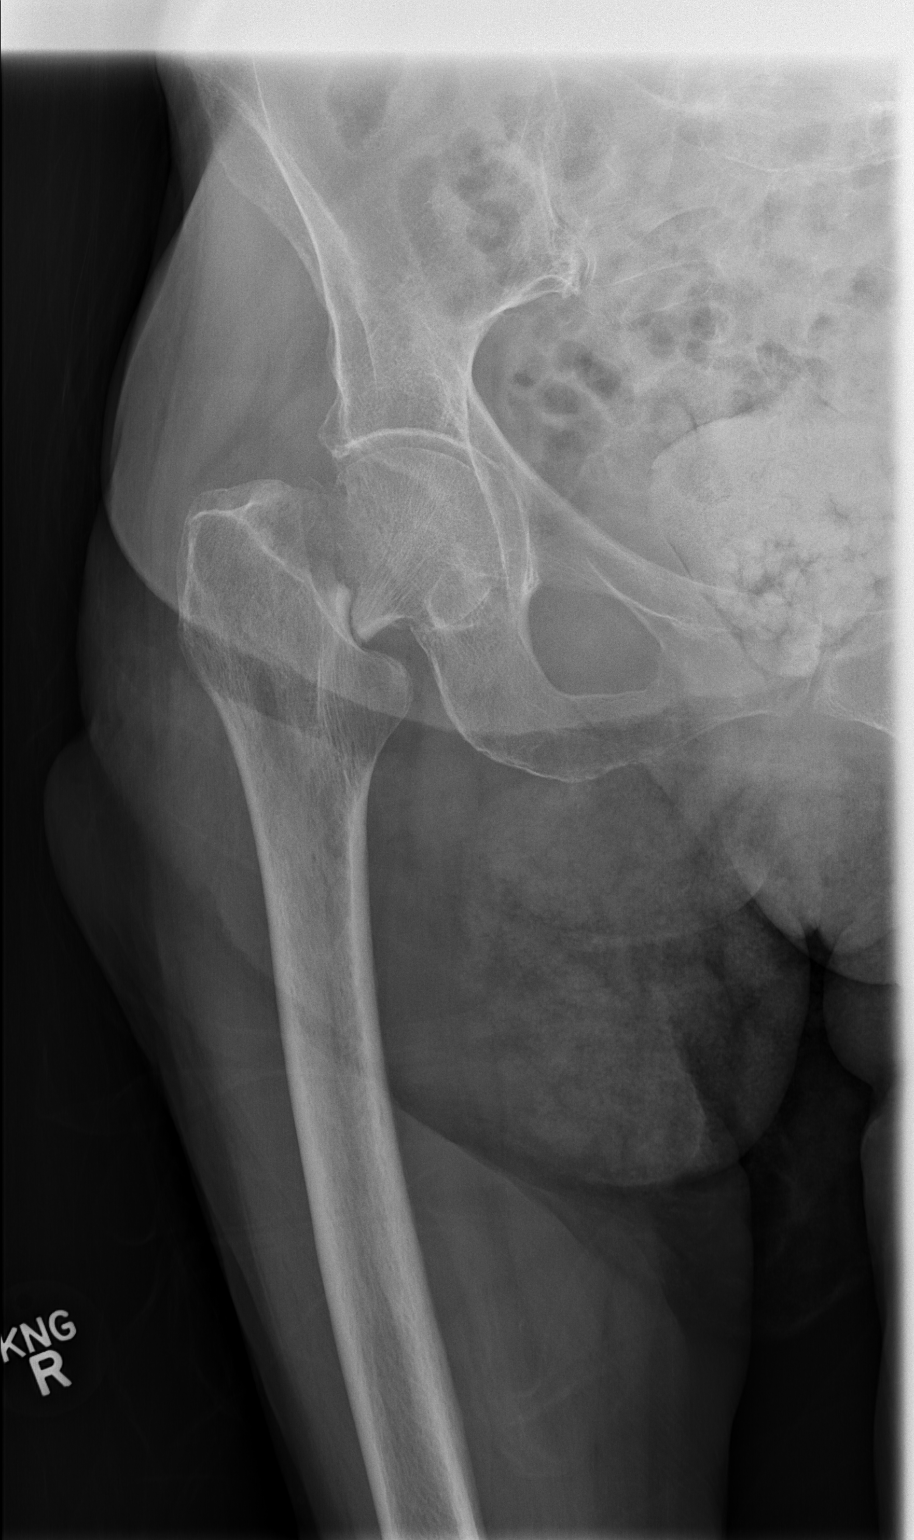

[w hip lat right]
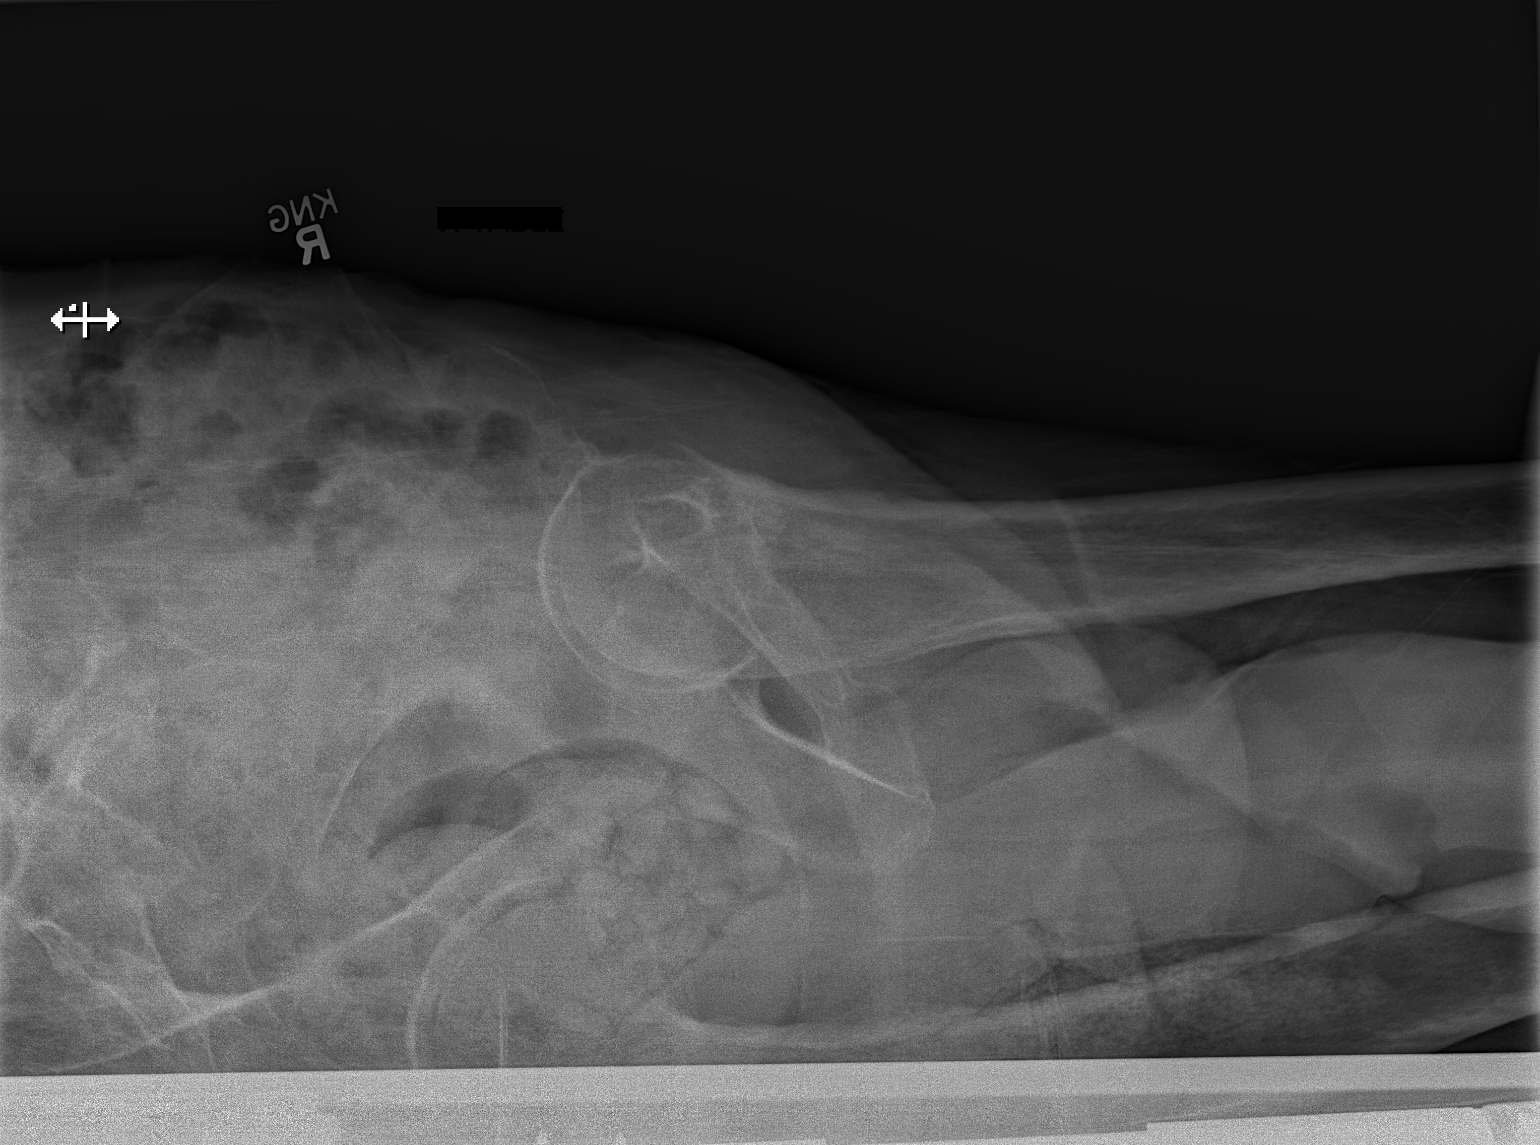

[3 of 3 positions shown; findings below may reference images not displayed]

FINDINGS: Acute transcervical femoral neck fracture on the right. The femoral
head remains located in the acetabulum. The visualized bony pelvis
is intact. Advanced degenerative change noted in the mid lumbar
spine. There is significant left lateral translation of L2 on L3
with associated chronic degenerative changes. Moderate colonic stool
burden. The bones are osteopenic.
IMPRESSION: Acute mildly impacted transcervical femoral neck fracture on the
right.

The bones are osteopenic.

## 2014-09-04 IMAGING — CT CT HEAD W/O CM
2 series · 17 of 30 positions shown, 20 images · non-contrast
Comparison: CT HEAD W/O CM dated 09/12/2012; CT C SPINE W/O CM dated
09/12/2012

CLINICAL DATA: Dementia, patient fell during the night

EXAM:
CT HEAD WITHOUT CONTRAST
TECHNIQUE: Contiguous axial images were obtained from the base of the skull
through the vertex without intravenous contrast.

[Series 2: head w/o · axial · non-contrast · 0.43mm/px · z∈[-124,-14]mm · 9 of 28 slices shown, 12 images]
[im 3/28  brain]
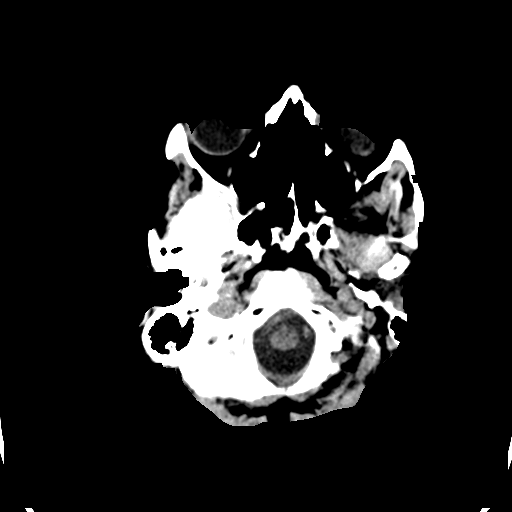
[im 3/28  bone]
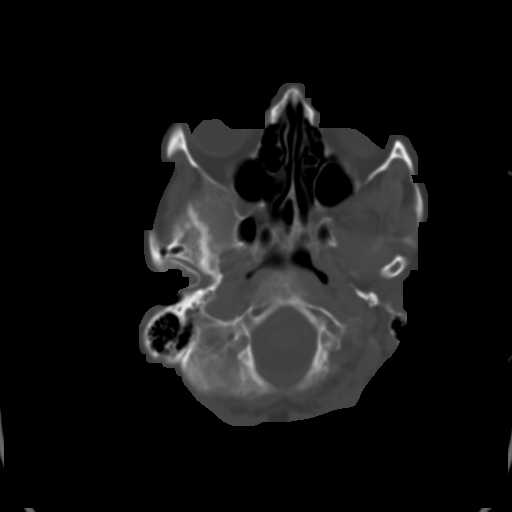
[im 6/28  brain]
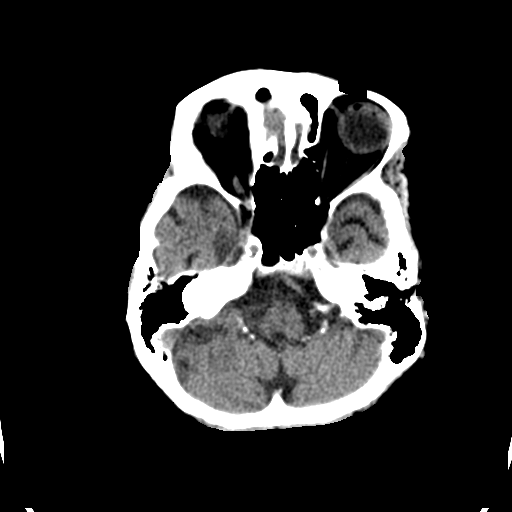
[im 9/28  brain]
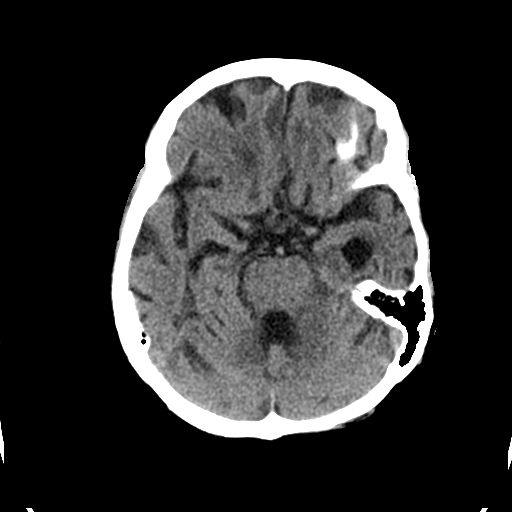
[im 11/28  brain]
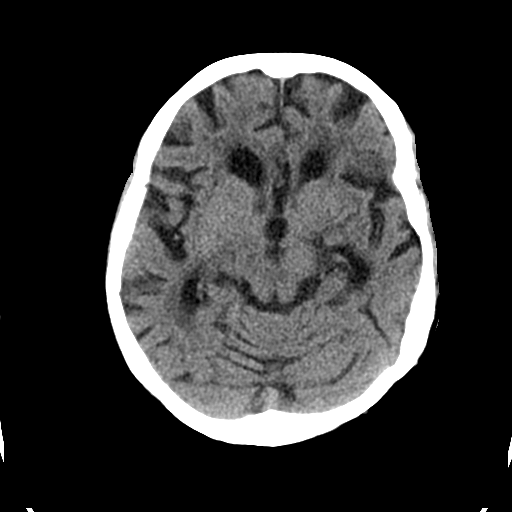
[im 14/28  brain]
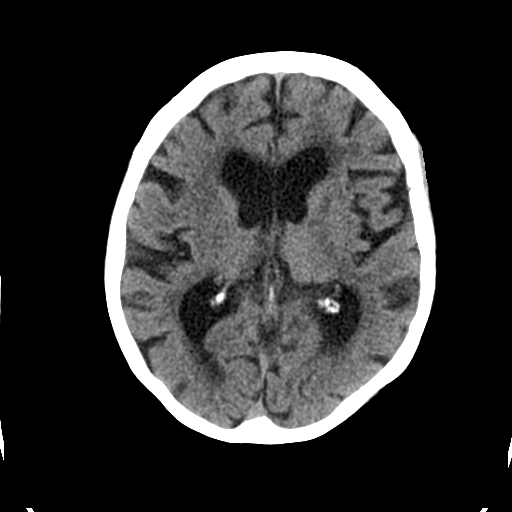
[im 14/28  bone]
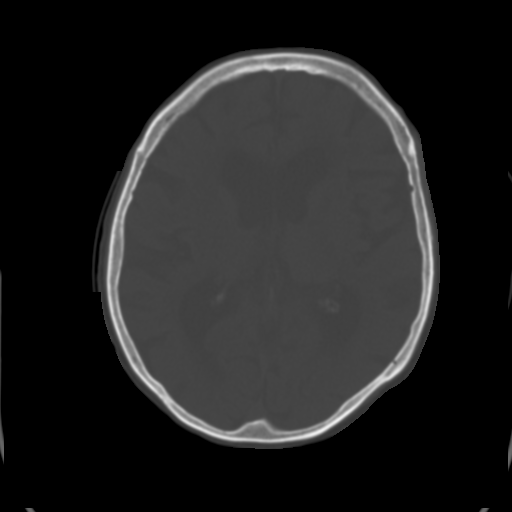
[im 17/28  brain]
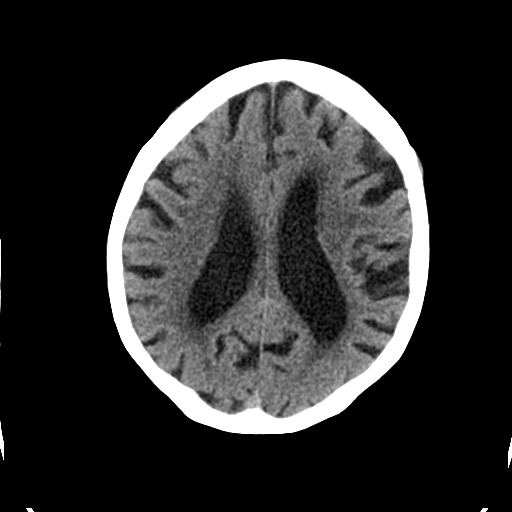
[im 19/28  brain]
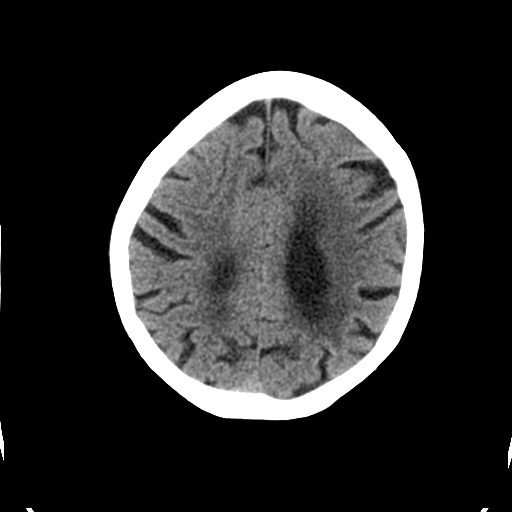
[im 22/28  brain]
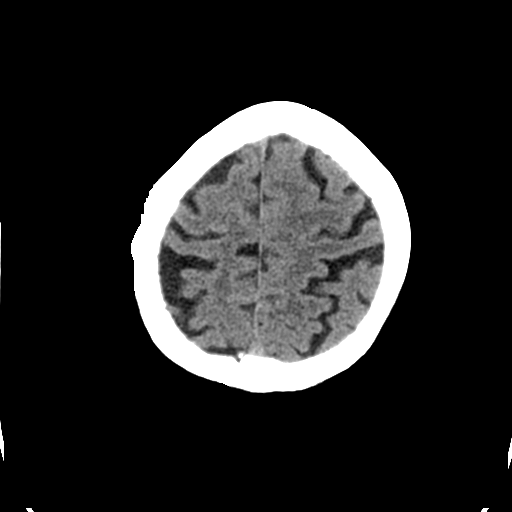
[im 25/28  brain]
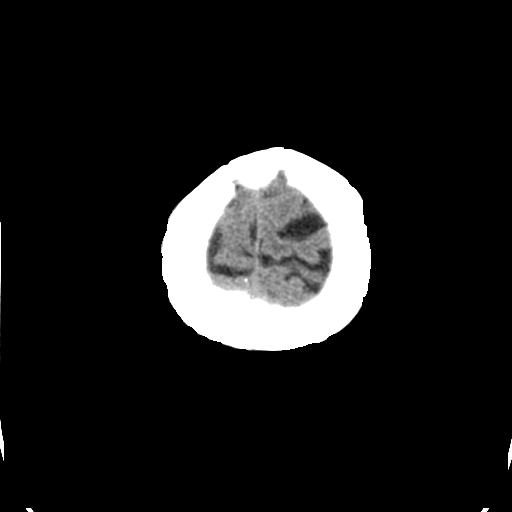
[im 25/28  bone]
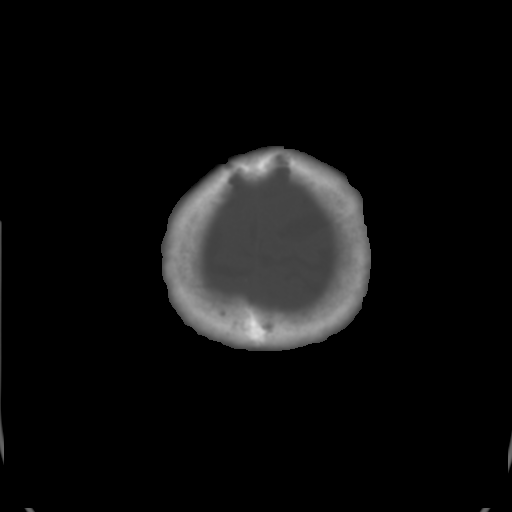

[Series 3: bone windows · axial · 0.43mm/px · z∈[-119,-14]mm · 8 of 46 slices shown]
[im 6/46  bone]
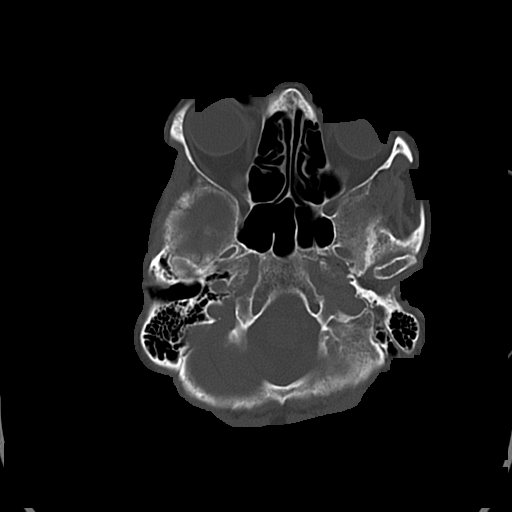
[im 11/46  bone]
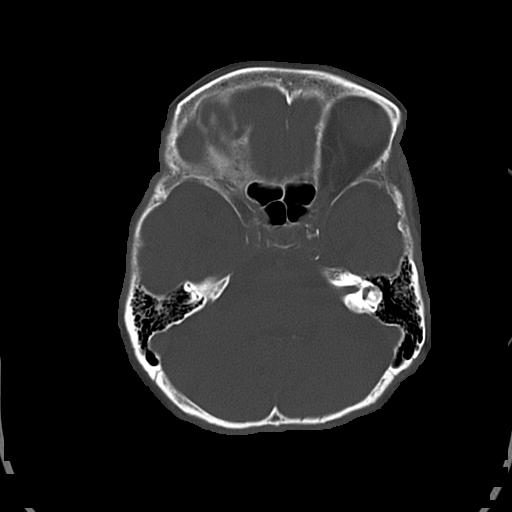
[im 16/46  bone]
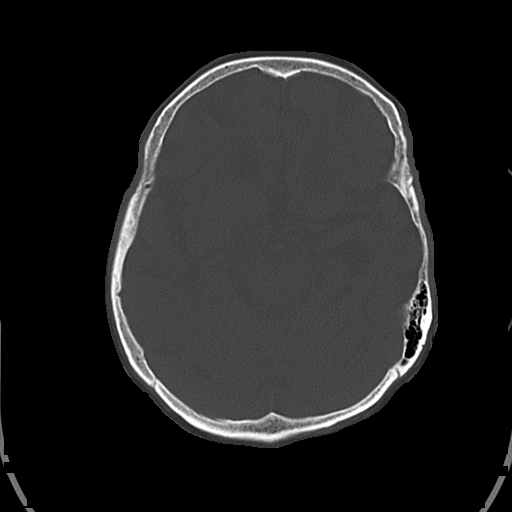
[im 21/46  bone]
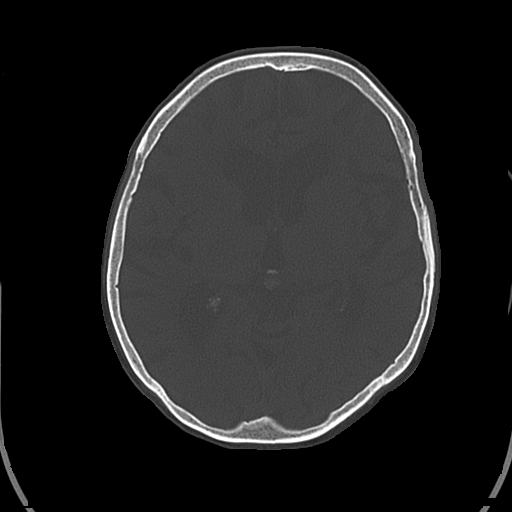
[im 26/46  bone]
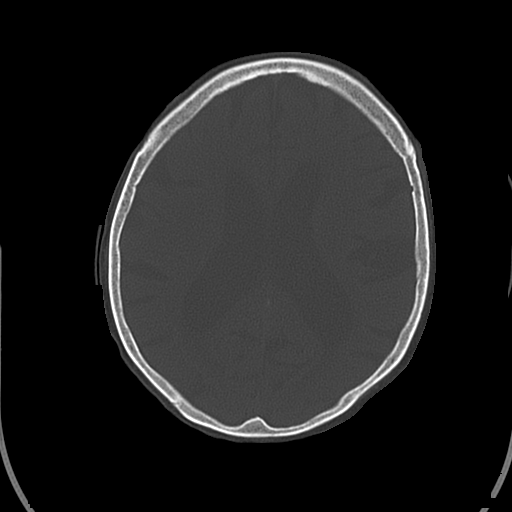
[im 31/46  bone]
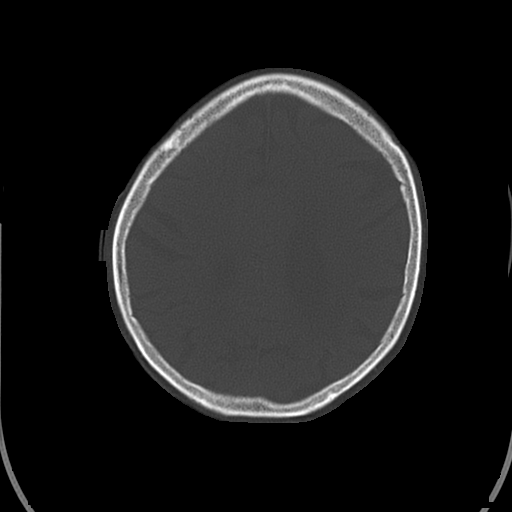
[im 36/46  bone]
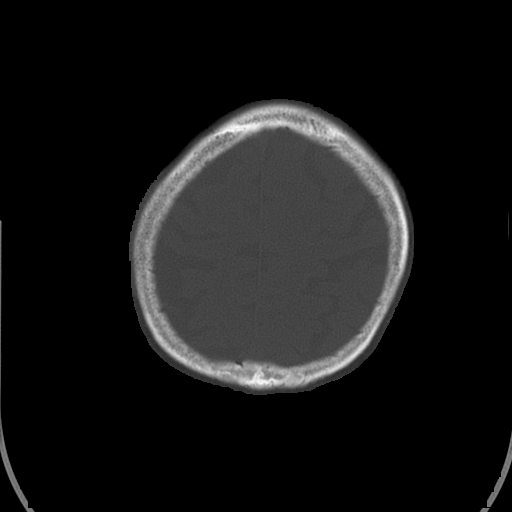
[im 41/46  bone]
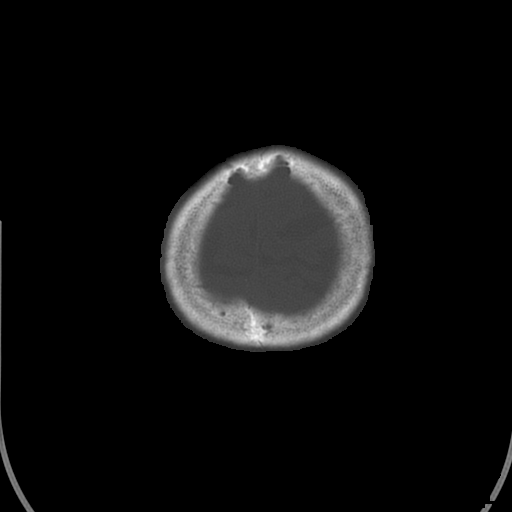

[17 of 30 positions shown; findings below may reference images not displayed]

FINDINGS: Significant diffuse atrophy. Low attenuation in the deep white
matter. No evidence of vascular territory infarct, mass, hemorrhage,
or extra-axial fluid. Calvarium is intact.
IMPRESSION: Significant chronic involutional change.  No acute findings.

## 2014-09-05 IMAGING — CR DG PORTABLE PELVIS
1 series · 1 of 1 positions shown · non-contrast
Comparison: None.

CLINICAL DATA: Postop right hip replacement

EXAM:
PORTABLE PELVIS 1-2 VIEWS

[AP]
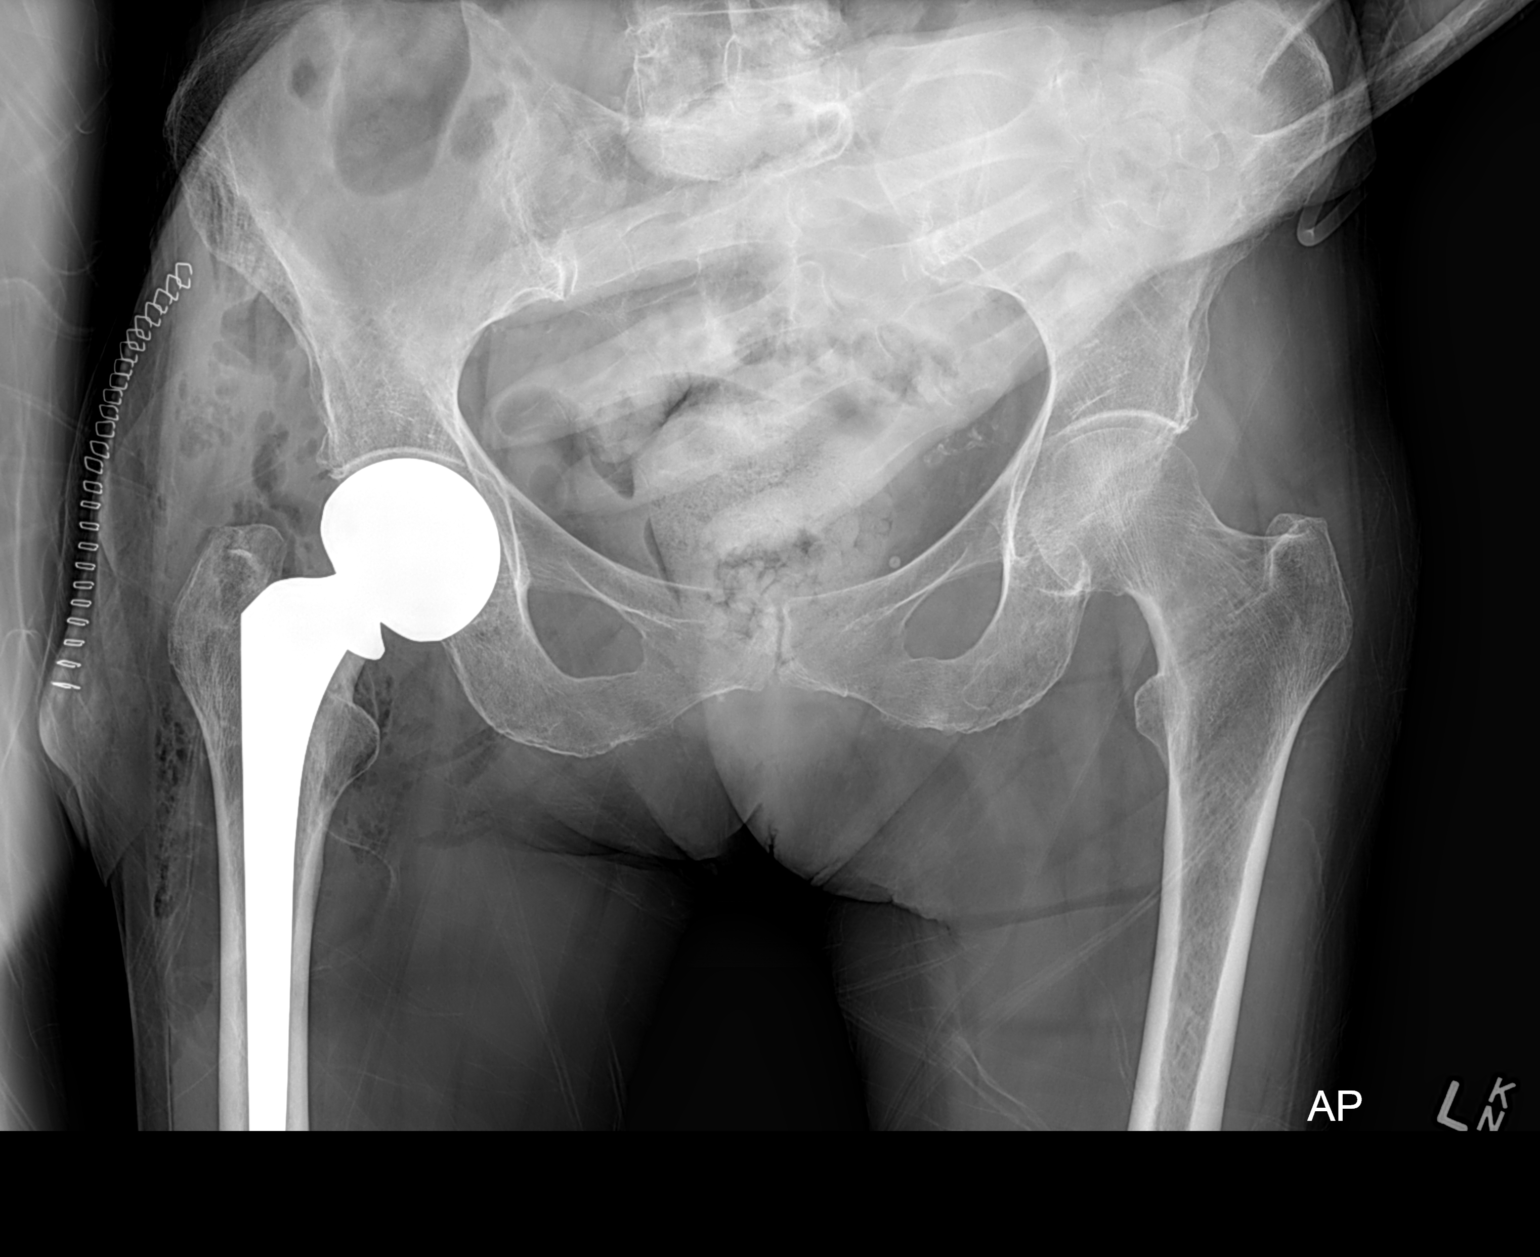

[1 of 1 positions shown; findings below may reference images not displayed]

FINDINGS: Unipolar right hip replacement identified in anticipated position.
Pelvic bones and proximal right femur within normal limits.
Postsurgical change over the right gluteal region and lower
extremity proximally.
IMPRESSION: Anticipated postoperative appearance.
